# Patient Record
Sex: Male | Born: 1965 | Race: White | Hispanic: No | Marital: Single | Smoking: Former smoker
Health system: Southern US, Community
[De-identification: ages and names within clinical notes are randomized; demographics above are authoritative.]

## PROBLEM LIST (undated history)

## (undated) DIAGNOSIS — I10 Essential (primary) hypertension: Secondary | ICD-10-CM

## (undated) DIAGNOSIS — E669 Obesity, unspecified: Secondary | ICD-10-CM

## (undated) HISTORY — DX: Essential (primary) hypertension: I10

## (undated) HISTORY — DX: Obesity, unspecified: E66.9

---

## 1998-09-30 ENCOUNTER — Emergency Department (HOSPITAL_COMMUNITY): Admission: EM | Admit: 1998-09-30 | Discharge: 1998-09-30 | Payer: Self-pay

## 1998-12-17 ENCOUNTER — Emergency Department (HOSPITAL_COMMUNITY): Admission: EM | Admit: 1998-12-17 | Discharge: 1998-12-17 | Payer: Self-pay | Admitting: Emergency Medicine

## 2006-10-13 ENCOUNTER — Ambulatory Visit: Payer: Self-pay | Admitting: Family Medicine

## 2006-10-13 ENCOUNTER — Encounter: Admission: RE | Admit: 2006-10-13 | Discharge: 2006-10-13 | Payer: Self-pay | Admitting: Family Medicine

## 2007-10-17 ENCOUNTER — Ambulatory Visit: Payer: Self-pay | Admitting: Family Medicine

## 2007-11-16 ENCOUNTER — Ambulatory Visit: Payer: Self-pay | Admitting: Family Medicine

## 2007-12-28 ENCOUNTER — Ambulatory Visit: Payer: Self-pay | Admitting: Family Medicine

## 2008-04-16 ENCOUNTER — Ambulatory Visit: Payer: Self-pay | Admitting: Family Medicine

## 2009-01-08 ENCOUNTER — Ambulatory Visit: Payer: Self-pay | Admitting: Family Medicine

## 2009-01-15 ENCOUNTER — Ambulatory Visit: Payer: Self-pay | Admitting: Family Medicine

## 2013-01-29 ENCOUNTER — Encounter: Payer: Self-pay | Admitting: Medical

## 2013-01-29 ENCOUNTER — Ambulatory Visit (INDEPENDENT_AMBULATORY_CARE_PROVIDER_SITE_OTHER): Payer: Self-pay | Admitting: Medical

## 2013-01-29 VITALS — BP 160/100 | HR 80 | Temp 98.1°F | Resp 16 | Wt 278.0 lb

## 2013-01-29 DIAGNOSIS — E669 Obesity, unspecified: Secondary | ICD-10-CM

## 2013-01-29 DIAGNOSIS — I10 Essential (primary) hypertension: Secondary | ICD-10-CM

## 2013-01-29 MED ORDER — LISINOPRIL 20 MG PO TABS: 20.0000 mg | ORAL_TABLET | Freq: Every day | ORAL | Status: DC

## 2013-01-29 NOTE — Patient Instructions (Signed)
Don't run out of blood pressure medication.  Begin back on Lisinopril 20mg  daily.    Work on losing weight, eating healthier, and exercising regularly, such as walking daily.   I recommend exercising most days of the week using a type of exercise that they would enjoy and stick to such as walking, running, swimming, hiking, biking, aerobics, etc.  I recommend a healthy diet.    Do's:   whole grains such as whole grain pasta, rice, whole grains breads and whole grain cereals.  Use small quantities such as 1/2 cup per serving or 2 slices of bread per serving.    Eat 3-5 fruits daily  Eat beans at least once daily  Eat almonds in small quantities at least 3 days per week    If they eat meat, I recommend small portions of lean meats such as chicken, fish, and [place].  Eat as much NON corn and NON potato vegetables as they like, particularly raw or steamed  Drink several large glasses of water daily  Cautions:  Limit red meat  Limit corn and potatoes  Limit sweets, cake, pie, candy  Limit beer and alcohol  Avoid fried food, fast food, large portions  Avoid sugary drinks such as regular soda and sweet tea

## 2013-01-29 NOTE — Progress Notes (Signed)
Subjective:    Patient here for follow-up of elevated blood pressure.  Last visit here over a year ago.  Ran out of BP medication a few months ago.  He is not exercising and is adherent to a low-salt diet.  However, he uses no other diet discretion.  Blood pressure is not well controlled at home. Cardiac symptoms: none. Patient denies: chest pain, chest pressure/discomfort, dyspnea, fatigue, palpitations and syncope. Cardiovascular risk factors: family history of premature cardiovascular disease, hypertension, male gender, obesity (BMI >= 30 kg/m2) and sedentary lifestyle. Use of agents associated with hypertension: none. History of target organ damage: none.  Lives at home with mother.  Currently unemployed.  The following portions of the patient's history were reviewed and updated as appropriate: allergies, current medications, past family history, past medical history, past social history, past surgical history and problem list.  Past Medical History  Diagnosis Date  . Hypertension   . Obesity     Review of Systems As in subjective above    Objective:   Filed Vitals:   01/29/13 1536  BP: 160/100  Pulse: 80  Temp: 98.1 F (36.7 C)  Resp: 16    General Appearance:    Alert, no distress, obese white male  Neck:   Supple, no lymphadenopathy;  thyroid:  no    enlargement/tenderness/nodules; no carotid   bruit or JVD  Lungs:     Clear to auscultation bilaterally without wheezes, rales or       rhonchi; respirations unlabored   Heart:    Regular rate and rhythm, S1 and S2 normal, no murmur  Abdomen:     Soft, non-tender, non distended, normoactive bowel sounds,    no masses, no hepatosplenomegaly, no bruits  Extremities:   No clubbing, cyanosis or edema  Pulses:   2+ and symmetric all extremities  Skin:   Warm, dry, normal turgor  Neurologic:   CN2-12 intact, normal strength, normal sensation, DTRs 2+, no cerebella signs, normal gait          Psych:   Normal mood, affect, hygiene  and grooming.      Assessment:   Encounter Diagnoses  Name Primary?  . Essential hypertension, benign Yes  . Obesity, unspecified     Plan:   HTN, obesity - advised better compliance, at a minimum - yearly follow up, restart Lisinopril, address lifestyle changes to lose weight, eat healthier, exercise regularly.  Discussed risks and complications of uncontrolled hypertension.  Recheck 50mo on BP and fasting labs.

## 2014-08-26 ENCOUNTER — Ambulatory Visit (INDEPENDENT_AMBULATORY_CARE_PROVIDER_SITE_OTHER): Payer: Self-pay | Admitting: Family Medicine

## 2014-08-26 ENCOUNTER — Encounter: Payer: Self-pay | Admitting: Family Medicine

## 2014-08-26 VITALS — BP 170/100 | HR 86 | Wt 289.0 lb

## 2014-08-26 DIAGNOSIS — I152 Hypertension secondary to endocrine disorders: Secondary | ICD-10-CM | POA: Insufficient documentation

## 2014-08-26 DIAGNOSIS — I10 Essential (primary) hypertension: Secondary | ICD-10-CM

## 2014-08-26 DIAGNOSIS — I517 Cardiomegaly: Secondary | ICD-10-CM

## 2014-08-26 DIAGNOSIS — E669 Obesity, unspecified: Secondary | ICD-10-CM

## 2014-08-26 DIAGNOSIS — R011 Cardiac murmur, unspecified: Secondary | ICD-10-CM

## 2014-08-26 DIAGNOSIS — E1159 Type 2 diabetes mellitus with other circulatory complications: Secondary | ICD-10-CM | POA: Insufficient documentation

## 2014-08-26 LAB — CBC WITH DIFFERENTIAL/PLATELET
Basophils Absolute: 0 10*3/uL (ref 0.0–0.1)
Basophils Relative: 0 % (ref 0–1)
Eosinophils Absolute: 0.2 10*3/uL (ref 0.0–0.7)
Eosinophils Relative: 3 % (ref 0–5)
HCT: 41.2 % (ref 39.0–52.0)
HEMOGLOBIN: 14.4 g/dL (ref 13.0–17.0)
LYMPHS ABS: 2 10*3/uL (ref 0.7–4.0)
Lymphocytes Relative: 26 % (ref 12–46)
MCH: 29.9 pg (ref 26.0–34.0)
MCHC: 35 g/dL (ref 30.0–36.0)
MCV: 85.5 fL (ref 78.0–100.0)
MONOS PCT: 9 % (ref 3–12)
Monocytes Absolute: 0.7 10*3/uL (ref 0.1–1.0)
NEUTROS ABS: 4.8 10*3/uL (ref 1.7–7.7)
NEUTROS PCT: 62 % (ref 43–77)
PLATELETS: 305 10*3/uL (ref 150–400)
RBC: 4.82 MIL/uL (ref 4.22–5.81)
RDW: 14.7 % (ref 11.5–15.5)
WBC: 7.8 10*3/uL (ref 4.0–10.5)

## 2014-08-26 LAB — COMPREHENSIVE METABOLIC PANEL
ALBUMIN: 4.2 g/dL (ref 3.5–5.2)
ALK PHOS: 69 U/L (ref 39–117)
ALT: 14 U/L (ref 0–53)
AST: 16 U/L (ref 0–37)
BUN: 17 mg/dL (ref 6–23)
CO2: 26 meq/L (ref 19–32)
Calcium: 9.1 mg/dL (ref 8.4–10.5)
Chloride: 101 mEq/L (ref 96–112)
Creat: 0.99 mg/dL (ref 0.50–1.35)
GLUCOSE: 97 mg/dL (ref 70–99)
POTASSIUM: 3.4 meq/L — AB (ref 3.5–5.3)
SODIUM: 139 meq/L (ref 135–145)
TOTAL PROTEIN: 7.2 g/dL (ref 6.0–8.3)
Total Bilirubin: 0.5 mg/dL (ref 0.2–1.2)

## 2014-08-26 LAB — LIPID PANEL
Cholesterol: 232 mg/dL — ABNORMAL HIGH (ref 0–200)
HDL: 39 mg/dL — ABNORMAL LOW (ref >39)
LDL CALC: 122 mg/dL — AB (ref 0–99)
TRIGLYCERIDES: 354 mg/dL — AB (ref <150)
Total CHOL/HDL Ratio: 5.9 Ratio
VLDL: 71 mg/dL — AB (ref 0–40)

## 2014-08-26 MED ORDER — LISINOPRIL-HYDROCHLOROTHIAZIDE 20-12.5 MG PO TABS: 1.0000 | ORAL_TABLET | Freq: Every day | ORAL | Status: DC

## 2014-08-26 NOTE — Patient Instructions (Signed)
You must take your blood pressure pill every day.

## 2014-08-26 NOTE — Progress Notes (Signed)
   Subjective:    Patient ID: Stanley Wilkerson, male    DOB: 16-Jul-1966, 48 y.o.   MRN: [id number]  HPI He is here for consult concerning his blood pressure. He has not been taking this on a regular basis. His last visit here was over a year ago. He has no chest pain, shortness of breath, PND.   Review of Systems     Objective:   Physical Exam alert and in no distress. Tympanic membranes and canals are normal. Throat is clear. Tonsils are normal. Neck is supple without adenopathy or thyromegaly. Cardiac exam shows a regular sinus rhythm with a 2/6  Murmur no gallops. Lungs are clear to auscultation. EKG shows LVH with strain pattern.       Assessment & Plan:  Essential hypertension - Plan: CBC with Differential, Comprehensive metabolic panel, Lipid panel, lisinopril-hydrochlorothiazide (ZESTORETIC) 20-12.5 MG per tablet  Obesity (BMI 30-39.9) - Plan: CBC with Differential, Comprehensive metabolic panel, Lipid panel  Undiagnosed cardiac murmurs - Plan: EKG 12-Lead, 2D Echocardiogram without contrast  LVH (left ventricular hypertrophy)  I reinforced the need for him to lose weight and to stay on his blood pressure medication regularly. Explained that blood pressure does not cause a lot of symptoms that people think. I will followup after he has his echo to evaluate the murmur.

## 2014-08-27 ENCOUNTER — Telehealth: Payer: Self-pay | Admitting: Family Medicine

## 2014-08-27 NOTE — Telephone Encounter (Signed)
Pt called and wanted to know what he was supposed to do.  And how to afford the test.  I explained there was paper work at [facility] [employer] for finances.

## 2014-08-29 ENCOUNTER — Ambulatory Visit (HOSPITAL_COMMUNITY)
Admission: RE | Admit: 2014-08-29 | Discharge: 2014-08-29 | Disposition: A | Discharge status: Home / Self Care | Payer: Self-pay | Source: Ambulatory Visit | Attending: Family Medicine | Admitting: Family Medicine

## 2014-08-29 DIAGNOSIS — Z8249 Family history of ischemic heart disease and other diseases of the circulatory system: Secondary | ICD-10-CM | POA: Insufficient documentation

## 2014-08-29 DIAGNOSIS — R011 Cardiac murmur, unspecified: Secondary | ICD-10-CM | POA: Insufficient documentation

## 2014-08-29 DIAGNOSIS — I10 Essential (primary) hypertension: Secondary | ICD-10-CM | POA: Insufficient documentation

## 2014-08-29 DIAGNOSIS — I517 Cardiomegaly: Secondary | ICD-10-CM

## 2014-08-29 NOTE — Progress Notes (Signed)
2D Echocardiogram Complete.  08/29/2014   Stanley Wilkerson [name], [place]

## 2014-09-01 ENCOUNTER — Telehealth: Payer: Self-pay | Admitting: Family Medicine

## 2014-09-01 NOTE — Telephone Encounter (Signed)
Pt called & I gave him labs results and echo result note from [facility].  He had received lab letter and thought all his labs were in the normal range.  Advised pt that no his cholesterol and Lipids are not in normal range.  Pt also wanted to let you know that his father died at 51 and and grandfather died at 41 both from heart attacks. He has appt in 1 month for reck

## 2014-09-25 ENCOUNTER — Encounter: Payer: Self-pay | Admitting: Family Medicine

## 2014-09-25 ENCOUNTER — Ambulatory Visit (INDEPENDENT_AMBULATORY_CARE_PROVIDER_SITE_OTHER): Payer: Self-pay | Admitting: Family Medicine

## 2014-09-25 VITALS — BP 140/92 | HR 88 | Wt 288.0 lb

## 2014-09-25 DIAGNOSIS — I10 Essential (primary) hypertension: Secondary | ICD-10-CM

## 2014-09-25 DIAGNOSIS — E669 Obesity, unspecified: Secondary | ICD-10-CM

## 2014-09-25 MED ORDER — AMLODIPINE BESYLATE 10 MG PO TABS: 10.0000 mg | ORAL_TABLET | Freq: Every day | ORAL | Status: DC

## 2014-09-25 NOTE — Progress Notes (Signed)
   Subjective:    Patient ID: Stanley Wilkerson, male    DOB: 1966-04-27, 48 y.o.   MRN: [id number]  HPI he is here for recheck. He is having no difficulty with his present medication regimen. He has lost 1 pound.  Review of Systems     Objective:   Physical Exam Alert and in no distress. Blood pressure is recorded       Assessment & Plan:  Essential hypertension - Plan: amLODipine (NORVASC) 10 MG tablet  Obesity (BMI 30-39.9)  I will add Norvasc to his regimen and recheck in 1 month.

## 2014-09-29 ENCOUNTER — Telehealth: Payer: Self-pay | Admitting: Internal Medicine

## 2014-09-29 MED ORDER — TERAZOSIN HCL 2 MG PO CAPS: 2.0000 mg | ORAL_CAPSULE | Freq: Every day | ORAL | Status: DC

## 2014-09-29 NOTE — Telephone Encounter (Signed)
Called pt to inform him Dr.Lalnde has called in new med pt verbalized understanding

## 2014-09-29 NOTE — Telephone Encounter (Signed)
Let him know that I called a different medication in. 

## 2014-09-29 NOTE — Telephone Encounter (Signed)
Pt states that amlodipine is $73.00 for a 90 day supply and pt can not afford that. Is there something else that is cheaper. Send to [facility] [address]

## 2014-10-27 ENCOUNTER — Encounter: Payer: Self-pay | Admitting: Family Medicine

## 2014-10-27 ENCOUNTER — Ambulatory Visit (INDEPENDENT_AMBULATORY_CARE_PROVIDER_SITE_OTHER): Payer: Self-pay | Admitting: Family Medicine

## 2014-10-27 VITALS — BP 160/90 | HR 88 | Wt 287.0 lb

## 2014-10-27 DIAGNOSIS — E669 Obesity, unspecified: Secondary | ICD-10-CM

## 2014-10-27 DIAGNOSIS — I10 Essential (primary) hypertension: Secondary | ICD-10-CM

## 2014-10-27 NOTE — Progress Notes (Signed)
   Subjective:    Patient ID: Stanley Wilkerson, male    DOB: January 15, 1966, 48 y.o.   MRN: [id number]  HPI He is here for recheck. He started on his new medication but then had some side effects that he blames on the medication. He then started again approximately one week ago and is having no difficulty with either medication   Review of Systems     Objective:   Physical Exam Alert and in no distress otherwise not examined. Blood pressure still elevated.       Assessment & Plan:  Essential hypertension  Obesity (BMI 30-39.9)  recommend that he stay on both medications regularly for the next month and then recheck since it is still elevated but he has inadequately treated his blood pressure.

## 2014-12-01 ENCOUNTER — Ambulatory Visit (INDEPENDENT_AMBULATORY_CARE_PROVIDER_SITE_OTHER): Payer: Self-pay | Admitting: Family Medicine

## 2014-12-01 ENCOUNTER — Encounter: Payer: Self-pay | Admitting: Family Medicine

## 2014-12-01 VITALS — BP 142/90 | HR 84 | Ht 67.5 in | Wt 284.0 lb

## 2014-12-01 DIAGNOSIS — E1169 Type 2 diabetes mellitus with other specified complication: Secondary | ICD-10-CM | POA: Insufficient documentation

## 2014-12-01 DIAGNOSIS — Z8249 Family history of ischemic heart disease and other diseases of the circulatory system: Secondary | ICD-10-CM

## 2014-12-01 DIAGNOSIS — E669 Obesity, unspecified: Secondary | ICD-10-CM

## 2014-12-01 DIAGNOSIS — I10 Essential (primary) hypertension: Secondary | ICD-10-CM

## 2014-12-01 DIAGNOSIS — E785 Hyperlipidemia, unspecified: Secondary | ICD-10-CM

## 2014-12-01 MED ORDER — PRAVASTATIN SODIUM 40 MG PO TABS: 40.0000 mg | ORAL_TABLET | Freq: Every day | ORAL | Status: DC

## 2014-12-01 NOTE — Patient Instructions (Signed)
No more soft drinks!!!!!

## 2014-12-01 NOTE — Progress Notes (Signed)
   Subjective:    Patient ID: Stanley Wilkerson, male    DOB: 19-Feb-1966, 49 y.o.   MRN: [id number]  HPI He is here for blood pressure recheck. He continues on medications listed in the chart. He has questions about cholesterol and then mentioned the fact that his father died at age 24 of a heart attack. Apparently symptoms of left arm pain started several years before that.   Review of Systems     Objective:   Physical Exam Alert and in no distress. Blood pressure is recorded.       Assessment & Plan:  Essential hypertension  Hyperlipidemia LDL goal <70 - Plan: pravastatin (PRAVACHOL) 40 MG tablet  Family history of heart disease in male family member before age 65  Obesity (BMI 30-39.9)  he will continue on his pleasant blood pressure medications. I will place him on proper call. Discussed possible side effects with him. Also strongly encouraged him to make further dietary changes specifically cutting back on his soda consumption. He admits to drinking 2 or 3 sodas every day. Check here in 3 months.

## 2014-12-03 ENCOUNTER — Telehealth: Payer: Self-pay | Admitting: Family Medicine

## 2014-12-03 MED ORDER — LOVASTATIN 20 MG PO TABS: 20.0000 mg | ORAL_TABLET | Freq: Every day | ORAL | Status: DC

## 2014-12-03 NOTE — Telephone Encounter (Signed)
Pt states Pravastatin too expensive, Can you switch pt to lovastatin, 10 mg or 20mg , which is on the $4 list at  [facility]

## 2015-03-02 ENCOUNTER — Ambulatory Visit: Payer: Self-pay | Admitting: Family Medicine

## 2015-03-03 ENCOUNTER — Ambulatory Visit: Payer: Self-pay | Admitting: Family Medicine

## 2015-09-24 ENCOUNTER — Encounter: Payer: Self-pay | Admitting: Family Medicine

## 2015-09-24 ENCOUNTER — Ambulatory Visit (INDEPENDENT_AMBULATORY_CARE_PROVIDER_SITE_OTHER): Payer: Self-pay | Admitting: Family Medicine

## 2015-09-24 VITALS — BP 140/90 | HR 68 | Wt 287.2 lb

## 2015-09-24 DIAGNOSIS — S161XXA Strain of muscle, fascia and tendon at neck level, initial encounter: Secondary | ICD-10-CM
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
## 2015-09-24 NOTE — Patient Instructions (Signed)
Use heat 20 minutes to neck and do stretches we talked about in the office. You can take 2 Aleve twice daily with food three times daily if needed for pain. Let me know if your feeling better in next 2-3 days.

## 2015-12-22 ENCOUNTER — Telehealth: Payer: Self-pay | Admitting: Family Medicine

## 2015-12-22 DIAGNOSIS — I10 Essential (primary) hypertension: Secondary | ICD-10-CM

## 2015-12-22 MED ORDER — LISINOPRIL-HYDROCHLOROTHIAZIDE 20-12.5 MG PO TABS: 1.0000 | ORAL_TABLET | Freq: Every day | ORAL | Status: DC

## 2015-12-22 NOTE — Telephone Encounter (Signed)
Refilled for 30 days only. Pt was notified that he needs an appt before anymore refills

## 2015-12-22 NOTE — Telephone Encounter (Signed)
Pt needs refill on his BP pills

## 2016-03-21 ENCOUNTER — Telehealth: Payer: Self-pay | Admitting: Family Medicine

## 2016-03-21 DIAGNOSIS — I10 Essential (primary) hypertension: Secondary | ICD-10-CM

## 2016-03-21 MED ORDER — TERAZOSIN HCL 2 MG PO CAPS: 2.0000 mg | ORAL_CAPSULE | Freq: Every day | ORAL | Status: DC

## 2016-03-21 MED ORDER — LISINOPRIL-HYDROCHLOROTHIAZIDE 20-12.5 MG PO TABS: 1.0000 | ORAL_TABLET | Freq: Every day | ORAL | Status: DC

## 2016-03-21 NOTE — Telephone Encounter (Signed)
Dr.Lalonde is this okay 

## 2016-03-21 NOTE — Telephone Encounter (Signed)
Pt requested refill on his BP meds, no job & insurance at this time and can't afford to come in and ask if you can refill

## 2016-08-19 ENCOUNTER — Telehealth: Payer: Self-pay | Admitting: Family Medicine

## 2016-08-19 DIAGNOSIS — I10 Essential (primary) hypertension: Secondary | ICD-10-CM

## 2016-08-19 MED ORDER — LISINOPRIL-HYDROCHLOROTHIAZIDE 20-12.5 MG PO TABS: 1.0000 | ORAL_TABLET | Freq: Every day | ORAL | 0 refills | Status: DC

## 2016-08-19 NOTE — Telephone Encounter (Signed)
Pt asked for refill BP meds, he doesn't have insurance at this time and no money to come in.

## 2016-08-19 NOTE — Telephone Encounter (Signed)
Ok

## 2016-08-19 NOTE — Telephone Encounter (Signed)
Sent med to pharmacy  

## 2016-12-13 ENCOUNTER — Telehealth: Payer: Self-pay | Admitting: Family Medicine

## 2016-12-13 DIAGNOSIS — I10 Essential (primary) hypertension: Secondary | ICD-10-CM

## 2016-12-13 MED ORDER — LISINOPRIL-HYDROCHLOROTHIAZIDE 20-12.5 MG PO TABS: 1.0000 | ORAL_TABLET | Freq: Every day | ORAL | 1 refills | Status: DC

## 2016-12-13 MED ORDER — TERAZOSIN HCL 2 MG PO CAPS: 2.0000 mg | ORAL_CAPSULE | Freq: Every day | ORAL | 1 refills | Status: DC

## 2016-12-13 NOTE — Telephone Encounter (Signed)
Pt still doesn't have insurance and wants to see if he can get another refill on BP meds, not working much with winter months, no money for appointment at this time.

## 2016-12-15 NOTE — Telephone Encounter (Signed)
Meds called in

## 2016-12-20 ENCOUNTER — Ambulatory Visit (INDEPENDENT_AMBULATORY_CARE_PROVIDER_SITE_OTHER): Payer: Self-pay | Admitting: Family Medicine

## 2016-12-20 VITALS — BP 150/90 | HR 70 | Wt 289.8 lb

## 2016-12-20 DIAGNOSIS — M25512 Pain in left shoulder: Secondary | ICD-10-CM

## 2016-12-20 DIAGNOSIS — I10 Essential (primary) hypertension: Secondary | ICD-10-CM

## 2016-12-20 MED ORDER — AMLODIPINE BESYLATE 5 MG PO TABS: 5.0000 mg | ORAL_TABLET | Freq: Every day | ORAL | 3 refills | Status: DC

## 2016-12-20 NOTE — Patient Instructions (Signed)
Take 800 mg of ibuprofen 3 times per day the next 2 weeks

## 2016-12-20 NOTE — Progress Notes (Signed)
   Subjective:    Patient ID: Beatriz Chancellor, male    DOB: [birth year], 51 y.o.   MRN: [id number]  HPI He complains of a one-day history of left shoulder pain. No history of previous injury or overuse. This started when he was taking groceries home. No numbness, tingling or weakness. He also states that he stopped taking the Hytrin due to the fact that it made him feel funny.   Review of Systems     Objective:   Physical Exam Questionable tenderness to the anterior part of the shoulder. No tenderness over before meals joint or bicipital groove. Full motion of the shoulder. Negative drop arm test. Supraspinatus testing was negative. Neer's and Hawkins test negative.       Assessment & Plan:  Essential hypertension - Plan: amLODipine (NORVASC) 5 MG tablet  Acute pain of left shoulder Recommend conservative care for the shoulder with ibuprofen 800 mg 3 times a day. Will also add Norvasc to his regimen. He should return here in one month for a recheck.

## 2016-12-21 ENCOUNTER — Encounter: Payer: Self-pay | Admitting: Family Medicine

## 2016-12-22 ENCOUNTER — Telehealth: Payer: Self-pay | Admitting: Family Medicine

## 2016-12-22 NOTE — Telephone Encounter (Signed)
Pt stopped by and states in the center of arm at shoulder is numb, burns and thumb is numb and tingling.  Please advise pt

## 2016-12-22 NOTE — Telephone Encounter (Signed)
Needs to get more time

## 2016-12-26 NOTE — Telephone Encounter (Signed)
Advised pt of same. 

## 2017-03-26 ENCOUNTER — Emergency Department (HOSPITAL_COMMUNITY)
Admission: EM | Admit: 2017-03-26 | Discharge: 2017-03-26 | Disposition: A | Discharge status: Home / Self Care | Payer: Self-pay | Attending: Emergency Medicine | Admitting: Emergency Medicine

## 2017-03-26 ENCOUNTER — Emergency Department (HOSPITAL_COMMUNITY): Payer: Self-pay

## 2017-03-26 ENCOUNTER — Encounter (HOSPITAL_COMMUNITY): Payer: Self-pay

## 2017-03-26 DIAGNOSIS — R002 Palpitations: Secondary | ICD-10-CM | POA: Insufficient documentation

## 2017-03-26 DIAGNOSIS — Z87891 Personal history of nicotine dependence: Secondary | ICD-10-CM | POA: Insufficient documentation

## 2017-03-26 DIAGNOSIS — I10 Essential (primary) hypertension: Secondary | ICD-10-CM | POA: Insufficient documentation

## 2017-03-26 DIAGNOSIS — M25562 Pain in left knee: Secondary | ICD-10-CM | POA: Insufficient documentation

## 2017-03-26 DIAGNOSIS — G8929 Other chronic pain: Secondary | ICD-10-CM | POA: Insufficient documentation

## 2017-03-26 LAB — I-STAT CHEM 8, ED
BUN: 18 mg/dL (ref 6–20)
CHLORIDE: 97 mmol/L — AB (ref 101–111)
CREATININE: 0.9 mg/dL (ref 0.61–1.24)
Calcium, Ion: 1.14 mmol/L — ABNORMAL LOW (ref 1.15–1.40)
GLUCOSE: 113 mg/dL — AB (ref 65–99)
HCT: 40 % (ref 39.0–52.0)
Hemoglobin: 13.6 g/dL (ref 13.0–17.0)
POTASSIUM: 3.3 mmol/L — AB (ref 3.5–5.1)
Sodium: 139 mmol/L (ref 135–145)
TCO2: 34 mmol/L (ref 0–100)

## 2017-03-26 LAB — I-STAT TROPONIN, ED: Troponin i, poc: 0.01 ng/mL (ref 0.00–0.08)

## 2017-03-26 MED ORDER — TRAMADOL HCL 50 MG PO TABS: 50.0000 mg | ORAL_TABLET | Freq: Two times a day (BID) | ORAL | 0 refills | Status: DC | PRN

## 2017-03-26 MED ORDER — POTASSIUM CHLORIDE CRYS ER 20 MEQ PO TBCR
40.0000 meq | EXTENDED_RELEASE_TABLET | Freq: Once | ORAL | Status: AC
  Administered 2017-03-26: 40 meq via ORAL
  Filled 2017-03-26: qty 2

## 2017-03-26 NOTE — ED Triage Notes (Signed)
States left knee pain for about a month able to bear weight no swelling noted and states for about a week states feels weak after a fast heart rate event.

## 2017-03-26 NOTE — Discharge Instructions (Signed)
Please follow up with heart doctor for further evaluation of your recurrent heart palpitation.  You may need a Holter heart monitor.  Eat green leafy vegetable and banana to help with your potassium level.  Wear your knee sleeve, exercise regularly and aim for weight loss as it will help your left knee pain from arthritis.  Take tramadol when your pain is severe.

## 2017-03-26 NOTE — ED Notes (Signed)
Pt reports todays complaints of randomized pain in left knee and shoulder that has been occurring for the last several months. Pt also reports palpitations that occur randomly. At present pt is not exhibiting any irregularities in heart rhythm.

## 2017-03-26 NOTE — ED Provider Notes (Signed)
[facility] DEPT Provider Note   CSN: [id number] Arrival date & time: 03/26/17  1930     History   Chief Complaint Chief Complaint  Patient presents with  . Knee Pain  . Tachycardia    HPI Stanley Wilkerson is a 51 y.o. male.  HPI   51 year old obese male with history of hypertension presenting complaining of knee pain and heart palpitation. Patient states for at least 3-4 months he has had recurrent left knee pain. Pain is to the lateral aspects of his knee, usually worsen with initiation of movement and standing and improves with rest. He uses a knee sleeves and Advil on occasion with some improvement. Symptoms still recurrent. Denies any specific injury to the knee. Denies any hip or ankle pain. No active pain at this time. No numbness or weakness.  Furthermore, patient also complaining of having intermittent heart palpitation. States that heart palpitation has been a recurrent symptoms ongoing for the past 2 weeks. Sometimes it may last for more than an hour and when it does he feels weak and tired and doesn't want to do anything. He denies any associated chest pain or shortness of breath. No report of lightheadedness of dizziness, just "feels weird" he admits to drinking soda but denies any other caffeine use, does not drink coffee and no recent medication changes. He is a nonsmoker or alcohol abuser. Does not have a PCP. He does admits to having history of high blood pressure and did not take his blood pressure medication today.  Past Medical History:  Diagnosis Date  . Hypertension   . Obesity     Patient Active Problem List   Diagnosis Date Noted  . Family history of heart disease in male family member before age 48 12/01/2014  . Hyperlipidemia LDL goal <70 12/01/2014  . LVH (left ventricular hypertrophy) 08/26/2014  . Obesity (BMI 30-39.9) 08/26/2014  . Essential hypertension 08/26/2014    History reviewed. No pertinent surgical history.     Home Medications     Prior to Admission medications   Medication Sig Start Date End Date Taking? Authorizing Provider  amLODipine (NORVASC) 5 MG tablet Take 1 tablet (5 mg total) by mouth daily. 12/20/16   Denita Lung, MD  lisinopril-hydrochlorothiazide (ZESTORETIC) 20-12.5 MG tablet Take 1 tablet by mouth daily. 12/13/16   Denita Lung, MD  lovastatin (MEVACOR) 20 MG tablet Take 1 tablet (20 mg total) by mouth at bedtime. Patient not taking: Reported on 09/24/2015 12/03/14   Denita Lung, MD    Family History Family History  Problem Relation Age of Onset  . Heart disease Father     Social History Social History  Substance Use Topics  . Smoking status: Former Research scientist (life sciences)  . Smokeless tobacco: Never Used  . Alcohol use No     Allergies   Penicillins   Review of Systems Review of Systems  All other systems reviewed and are negative.    Physical Exam Updated Vital Signs BP (!) 180/99 (BP Location: Left Arm)   Pulse 72   Temp 98.1 F (36.7 C) (Oral)   Resp 18   SpO2 98%   Physical Exam  Constitutional: He appears well-developed and well-nourished. No distress.  HENT:  Head: Atraumatic.  Eyes: Conjunctivae are normal.  Neck: Neck supple.  Cardiovascular: Normal rate and regular rhythm.   Pulmonary/Chest: Effort normal and breath sounds normal.  Abdominal: Soft. Bowel sounds are normal. He exhibits no distension. There is no tenderness.  Musculoskeletal: He exhibits tenderness (  Left knee: Mild tenderness to the lateral joint line on the. No joint laxity, no deformity, normal knee flexion extension, no overlying skin changes, no crepitus.).  Left hip and left ankle nontender. Dorsalis pedis pulse palpable.  Neurological: He is alert.  Skin: No rash noted.  Psychiatric: He has a normal mood and affect.  Nursing note and vitals reviewed.    ED Treatments / Results  Labs (all labs ordered are listed, but only abnormal results are displayed) Labs Reviewed  I-STAT CHEM 8, ED -  Abnormal; Notable for the following:       Result Value   Potassium 3.3 (*)    Chloride 97 (*)    Glucose, Bld 113 (*)    Calcium, Ion 1.14 (*)    All other components within normal limits  I-STAT TROPOININ, ED    EKG  EKG Interpretation  Date/Time:  Sunday March 26 2017 19:37:10 EDT Ventricular Rate:  68 PR Interval:    QRS Duration: 97 QT Interval:  439 QTC Calculation: 467 R Axis:   66 Text Interpretation:  Sinus rhythm LAE, consider biatrial enlargement Repol abnrm suggests ischemia, lateral leads No significant change since last tracing 08/22/14 Reconfirmed by Hazle Coca [phone]) on 03/26/2017 9:26:31 PM       Radiology Dg Chest 2 View  Result Date: 03/26/2017 CLINICAL DATA:  Heart palpitations. EXAM: CHEST  2 VIEW COMPARISON:  10/13/2006 FINDINGS: Cardiomediastinal silhouette is normal. Mediastinal contours appear intact. There is no evidence of focal airspace consolidation, pleural effusion or pneumothorax. Osseous structures are without acute abnormality. Soft tissues are grossly normal. IMPRESSION: No active cardiopulmonary disease. Electronically Signed   By: Fidela Salisbury M.D.   On: 03/26/2017 20:56   Dg Knee 2 Views Left  Result Date: 03/26/2017 CLINICAL DATA:  Generalized left knee pain. EXAM: LEFT KNEE - 1-2 VIEW COMPARISON:  None. FINDINGS: No evidence of fracture, dislocation, or joint effusion. Faint calcifications within the medial and lateral joint compartments are seen. No significant joint space narrowing. Soft tissues are unremarkable. IMPRESSION: No acute fracture or dislocation identified about the left knee. Faint calcifications within the medial and lateral joint compartments, which may represent meniscal calcifications or CPPD arthropathy. Electronically Signed   By: Fidela Salisbury M.D.   On: 03/26/2017 20:59    Procedures Procedures (including critical care time)  Medications Ordered in ED Medications  potassium chloride SA (K-DUR,KLOR-CON)  CR tablet 40 mEq (40 mEq Oral Given 03/26/17 2123)     Initial Impression / Assessment and Plan / ED Course  I have reviewed the triage vital signs and the nursing notes.  Pertinent labs & imaging results that were available during my care of the patient were reviewed by me and considered in my medical decision making (see chart for details).     BP (!) 148/87 (BP Location: Left Arm)   Pulse 66   Temp 98.1 F (36.7 C) (Oral)   Resp 14   Ht 5\' 7"  (1.702 m)   Wt 131.1 kg   SpO2 97%   BMI 45.26 kg/m    Final Clinical Impressions(s) / ED Diagnoses   Final diagnoses:  Chronic pain of left knee  Heart palpitations    New Prescriptions New Prescriptions   TRAMADOL (ULTRAM) 50 MG TABLET    Take 1 tablet (50 mg total) by mouth every 12 (twelve) hours as needed for moderate pain.   8:37 PM Patient here with multiple complaints, one is his heart palpitation which has been a recurrent theme  for the past 2 weeks, the other is his recurrent left lateral knee pain. No significant tenderness on evaluation of his left knee. He is not tachycardic and no appreciable heart palpitation noted on exam. Plan to obtain an i-STAT Chem-8 to check his electrolytes, will obtain an EKG, x-ray of his chest and his left knee.  9:42 PM Labs showing mild hypokalemia, K+ 3.3.  Supplementation given.  Recommend eating banana daily x 1 week.  Xray of L knee without acute changes, faint calcification within the medial and lateral joint compartment which may represent meniscal calcification or CPPD arthropathy.  I believe this does support his pain, likely arthritis.  Encourage knee sleeve, Tylenol and outpt f/u with ortho as needed.  Report heart palpitation, no concern arrhythmia on EKG, CXR unremarkable, and normal trop.  Recommend avoid soda use, encourage outpt f/u with cardiology for care.  Return precaution given.    Domenic Moras, PA-C 03/26/17 [place], MD 03/27/17 Dyann Kief

## 2017-03-26 NOTE — ED Notes (Signed)
Patient transported to X-ray 

## 2017-05-19 ENCOUNTER — Other Ambulatory Visit: Payer: Self-pay

## 2017-05-19 ENCOUNTER — Telehealth: Payer: Self-pay | Admitting: Family Medicine

## 2017-05-19 DIAGNOSIS — I10 Essential (primary) hypertension: Secondary | ICD-10-CM

## 2017-05-19 MED ORDER — LISINOPRIL-HYDROCHLOROTHIAZIDE 20-12.5 MG PO TABS
1.0000 | ORAL_TABLET | Freq: Every day | ORAL | 1 refills | Status: DC
Start: 2017-05-19 — End: 2018-03-09

## 2017-05-19 NOTE — Telephone Encounter (Signed)
Lisinopril sent in  

## 2017-05-19 NOTE — Telephone Encounter (Signed)
Pt called and states that he needs a refill on his lisinopril pt use [place], [place] - [address] pt can be reached at [phone]

## 2018-03-09 ENCOUNTER — Other Ambulatory Visit: Payer: Self-pay

## 2018-03-09 ENCOUNTER — Telehealth: Payer: Self-pay | Admitting: Medical

## 2018-03-09 DIAGNOSIS — I10 Essential (primary) hypertension: Secondary | ICD-10-CM

## 2018-03-09 MED ORDER — AMLODIPINE BESYLATE 5 MG PO TABS: 5.0000 mg | ORAL_TABLET | Freq: Every day | ORAL | 0 refills | Status: DC

## 2018-03-09 MED ORDER — LISINOPRIL-HYDROCHLOROTHIAZIDE 20-12.5 MG PO TABS: 1.0000 | ORAL_TABLET | Freq: Every day | ORAL | 0 refills | Status: DC

## 2018-03-09 NOTE — Telephone Encounter (Signed)
pls send 1 round of his BP medications

## 2018-03-09 NOTE — Telephone Encounter (Signed)
Completed.

## 2018-03-09 NOTE — Telephone Encounter (Signed)
Pt needs refills on bp med. [facility].

## 2018-08-09 IMAGING — CR DG KNEE 1-2V*L*
2 series · 2 of 2 positions shown · non-contrast
Comparison: None.

CLINICAL DATA: Generalized left knee pain.

EXAM:
LEFT KNEE - 1-2 VIEW

[t knee ap left]
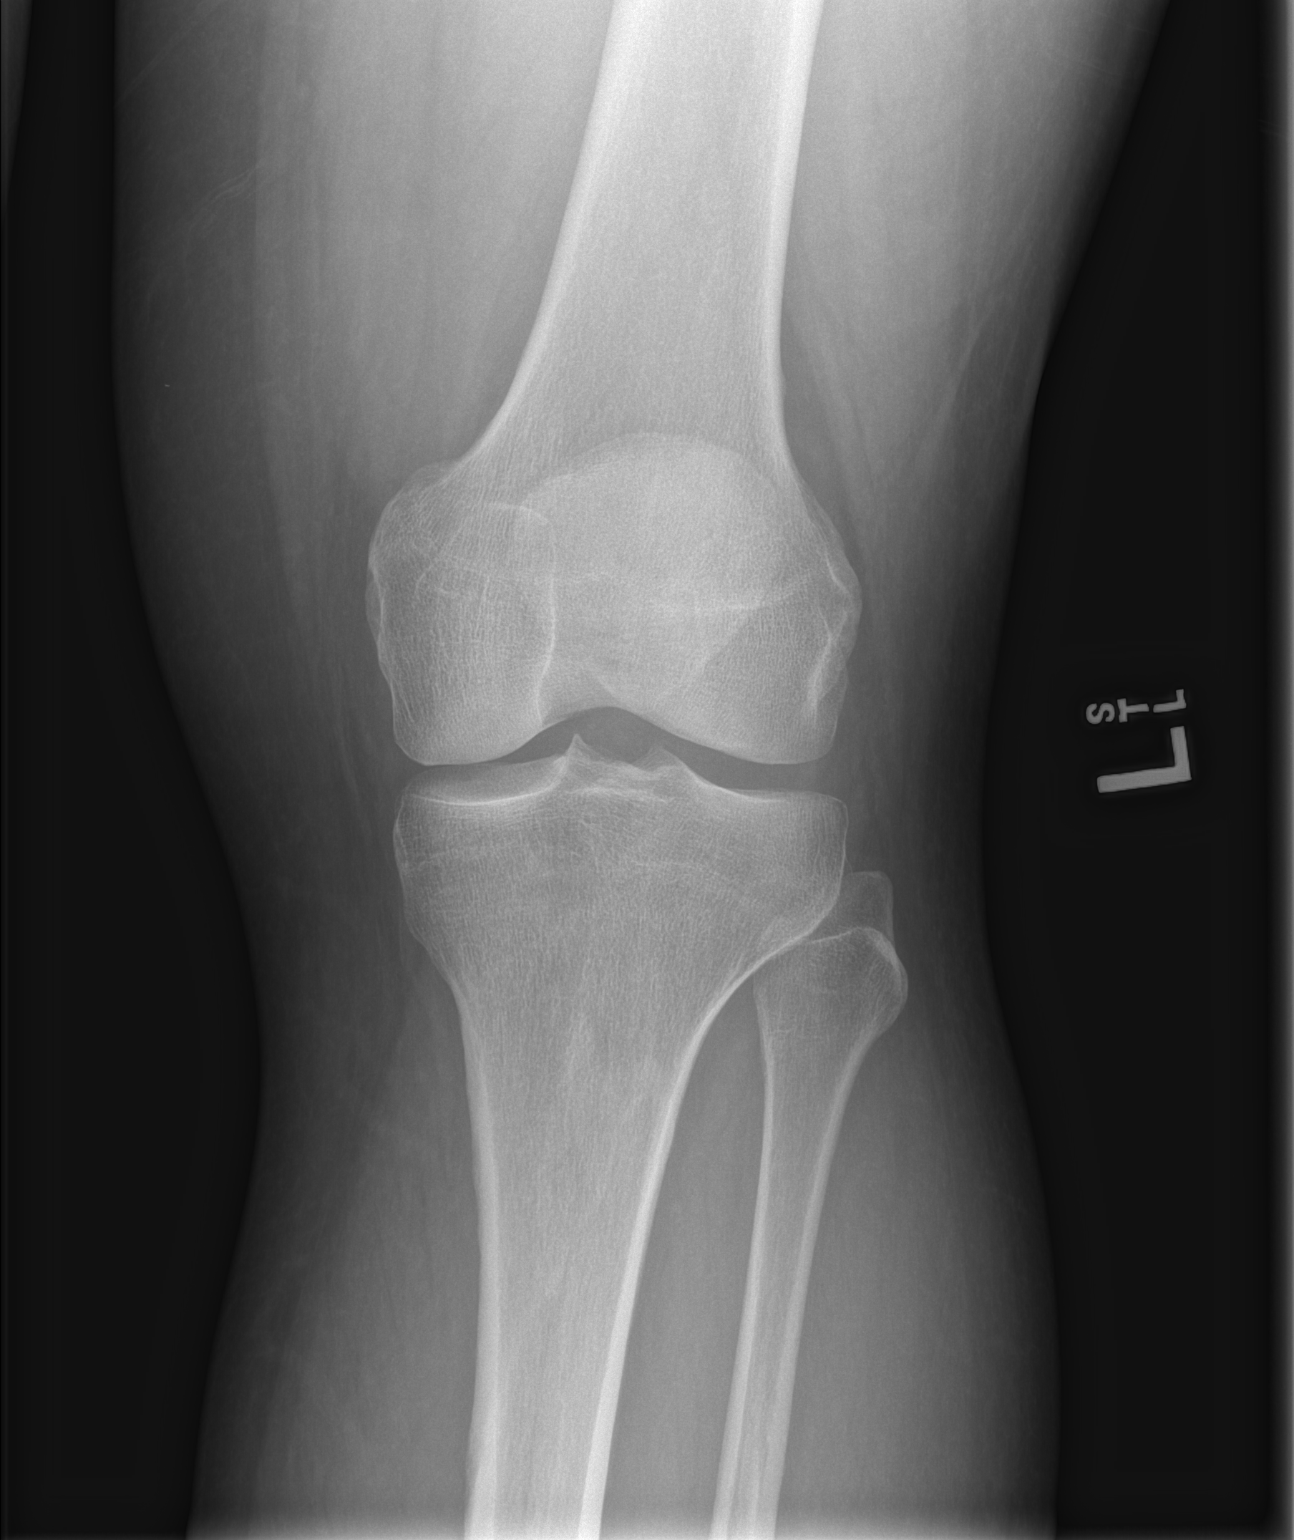

[t knee lat left]
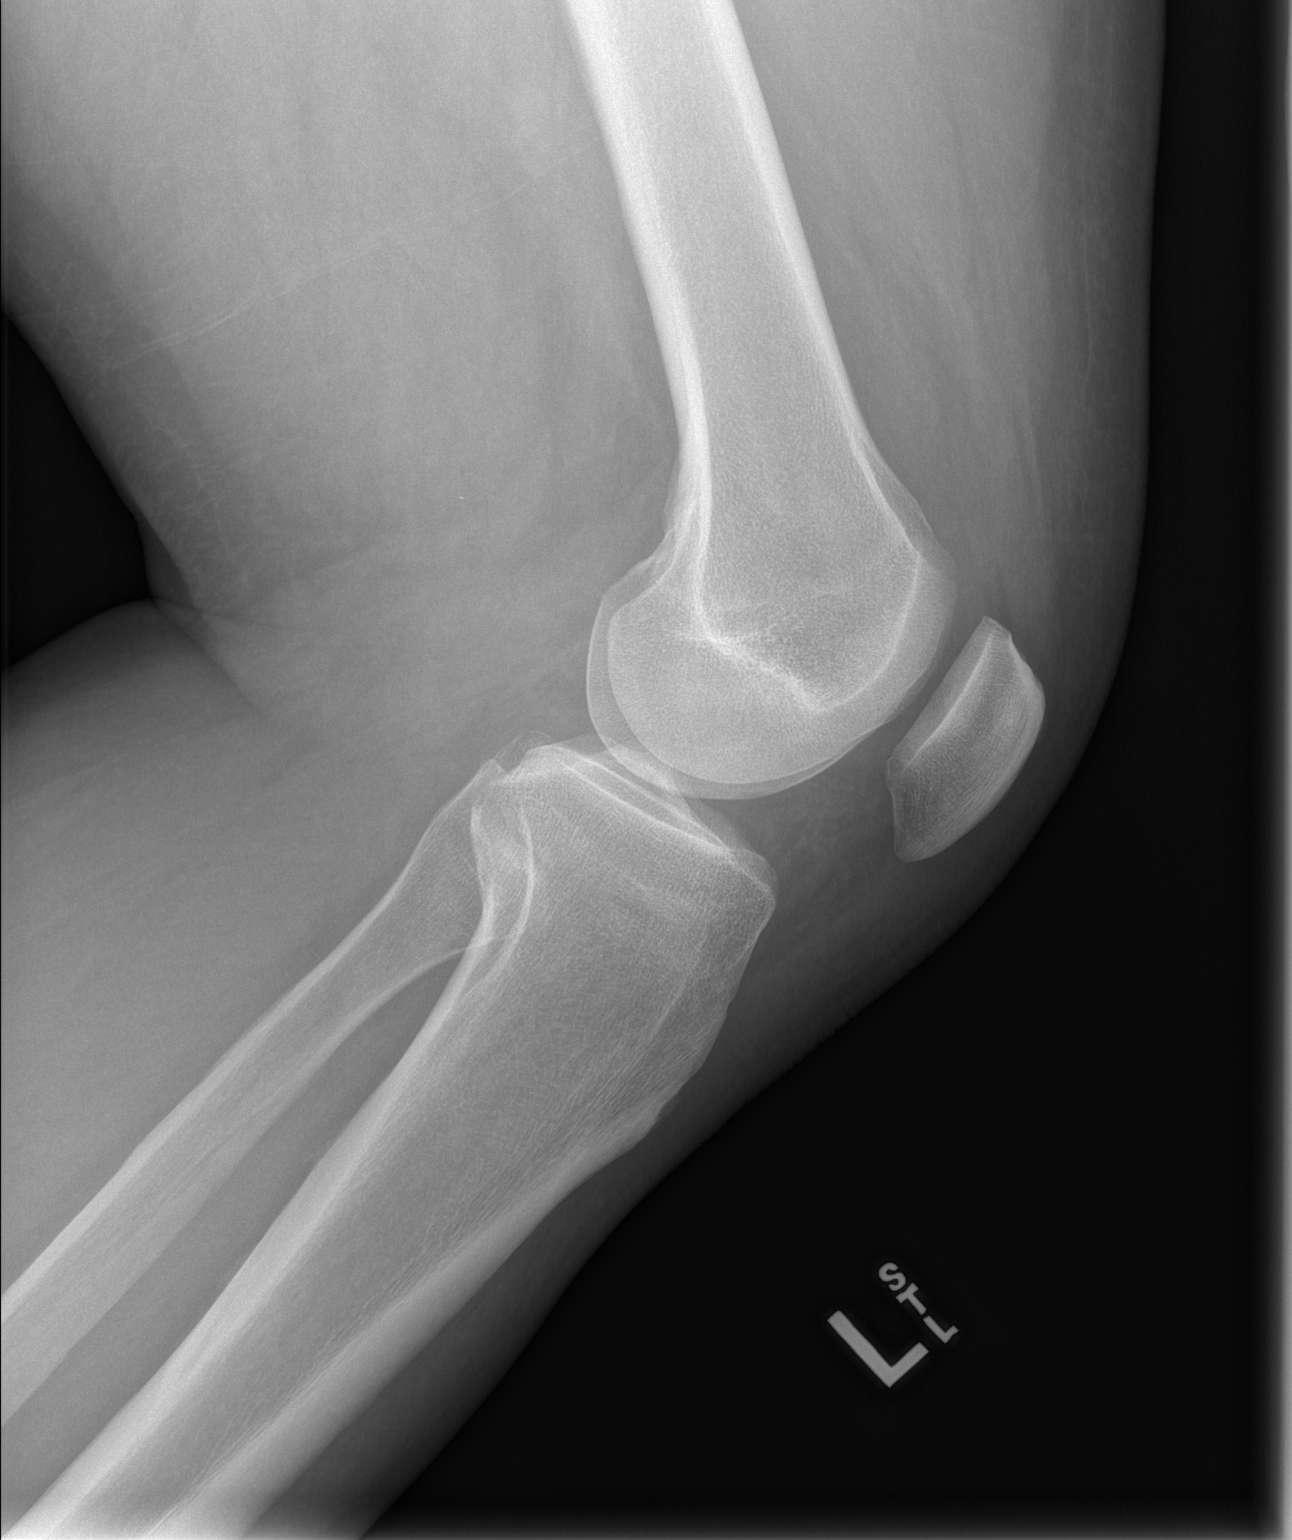

[2 of 2 positions shown; findings below may reference images not displayed]

FINDINGS: No evidence of fracture, dislocation, or joint effusion. Faint
calcifications within the medial and lateral joint compartments are
seen. No significant joint space narrowing. Soft tissues are
unremarkable.
IMPRESSION: No acute fracture or dislocation identified about the left knee.

Faint calcifications within the medial and lateral joint
compartments, which may represent meniscal calcifications or CPPD
arthropathy.

## 2019-02-07 ENCOUNTER — Encounter: Payer: Self-pay | Admitting: Family Medicine

## 2019-02-07 ENCOUNTER — Other Ambulatory Visit: Payer: Self-pay

## 2019-02-07 ENCOUNTER — Ambulatory Visit (INDEPENDENT_AMBULATORY_CARE_PROVIDER_SITE_OTHER): Payer: 59 | Admitting: Family Medicine

## 2019-02-07 VITALS — BP 160/98 | HR 71 | Temp 98.4°F | Ht 67.0 in | Wt 272.6 lb

## 2019-02-07 DIAGNOSIS — R0683 Snoring: Secondary | ICD-10-CM | POA: Diagnosis not present

## 2019-02-07 DIAGNOSIS — Z0000 Encounter for general adult medical examination without abnormal findings: Secondary | ICD-10-CM

## 2019-02-07 DIAGNOSIS — Z23 Encounter for immunization: Secondary | ICD-10-CM

## 2019-02-07 DIAGNOSIS — R5383 Other fatigue: Secondary | ICD-10-CM | POA: Diagnosis not present

## 2019-02-07 DIAGNOSIS — E785 Hyperlipidemia, unspecified: Secondary | ICD-10-CM | POA: Diagnosis not present

## 2019-02-07 DIAGNOSIS — Z1211 Encounter for screening for malignant neoplasm of colon: Secondary | ICD-10-CM

## 2019-02-07 DIAGNOSIS — Z8249 Family history of ischemic heart disease and other diseases of the circulatory system: Secondary | ICD-10-CM | POA: Diagnosis not present

## 2019-02-07 DIAGNOSIS — E669 Obesity, unspecified: Secondary | ICD-10-CM

## 2019-02-07 DIAGNOSIS — I10 Essential (primary) hypertension: Secondary | ICD-10-CM | POA: Diagnosis not present

## 2019-02-07 DIAGNOSIS — I517 Cardiomegaly: Secondary | ICD-10-CM

## 2019-02-07 LAB — POCT URINALYSIS DIP (PROADVANTAGE DEVICE)
BILIRUBIN UA: NEGATIVE
BILIRUBIN UA: NEGATIVE mg/dL
Blood, UA: NEGATIVE
Glucose, UA: NEGATIVE mg/dL
Leukocytes, UA: NEGATIVE
Nitrite, UA: NEGATIVE
SPECIFIC GRAVITY, URINE: 1.025
Urobilinogen, Ur: 3.5
pH, UA: 6 (ref 5.0–8.0)

## 2019-02-07 MED ORDER — ATORVASTATIN CALCIUM 20 MG PO TABS: 20.0000 mg | ORAL_TABLET | Freq: Every day | ORAL | 4 refills | Status: DC

## 2019-02-07 MED ORDER — LISINOPRIL-HYDROCHLOROTHIAZIDE 20-12.5 MG PO TABS: 1.0000 | ORAL_TABLET | Freq: Every day | ORAL | 0 refills | Status: DC

## 2019-02-07 MED ORDER — AMLODIPINE BESYLATE 5 MG PO TABS: 5.0000 mg | ORAL_TABLET | Freq: Every day | ORAL | 0 refills | Status: DC

## 2019-02-07 NOTE — Progress Notes (Signed)
Subjective:    Patient ID: Stanley Wilkerson, male    DOB: Mar 29, 1966, 53 y.o.   MRN: [id number]  HPI He is here for complete examination.  He has not been taking his medications regularly.  He stopped taking his statin drug and has not taken the full complement of him as blood pressure medications.  He does complain of snoring and also gives a history of falling asleep easily even while at work.  He also complains of fatigue.  Does complain of some right hip/groin discomfort but this goes away fairly quickly.  He does have a family history of heart disease.  His exercise level is quite limited.  Does have a previous history of left atrial dilatation and has not had follow-up in 5 years.  He does not smoke or drink.  He is not sexually active.  Family and social history as well as health maintenance and immunizations was also reviewed.   Review of Systems  All other systems reviewed and are negative.      Objective:   Physical Exam BP (!) 160/98 (BP Location: Left Arm, Patient Position: Sitting)   Pulse 71   Temp 98.4 F (36.9 C)   Ht 5\' 7"  (1.702 m)   Wt 272 lb 9.6 oz (123.7 kg)   SpO2 97%   BMI 42.70 kg/m   General Appearance:    Alert, cooperative, no distress, appears stated age  Head:    Normocephalic, without obvious abnormality, atraumatic  Eyes:    PERRL, conjunctiva/corneas clear, EOM's intact, fundi    benign  Ears:    Normal TM's and external ear canals  Nose:   Nares normal, mucosa normal, no drainage or sinus   tenderness  Throat:   Lips, mucosa, and tongue normal; teeth and gums normal  Neck:   Supple, no lymphadenopathy;  thyroid:  no   enlargement/tenderness/nodules; no carotid   bruit or JVD  Back:    Spine nontender, no curvature, ROM normal, no CVA     tenderness  Lungs:     Clear to auscultation bilaterally without wheezes, rales or     ronchi; respirations unlabored      Heart:    Regular rate and rhythm, S1 and S2 normal, 2/6 SEM noted.     Abdomen:      Soft, non-tender, nondistended, normoactive bowel sounds,    no masses, no hepatosplenomegaly  Genitalia:   Deferred  Rectal:   Deferred  Extremities:   No clubbing, cyanosis or edema right hip motion is normal.  Pulses:   2+ and symmetric all extremities  Skin:   Skin color, texture, turgor normal, no rashes or lesions  Lymph nodes:   Cervical, supraclavicular, and axillary nodes normal  Neurologic:   CNII-XII intact, normal strength, sensation and gait; reflexes 2+ and symmetric throughout          Psych:   Normal mood, affect, hygiene and grooming.          Assessment & Plan:  Routine general medical examination at a health care facility - Plan: CBC with Differential/Platelet, Comprehensive metabolic panel, Lipid panel, POCT Urinalysis DIP (Proadvantage Device)  Snoring - Plan: Home sleep test  Essential hypertension - Plan: lisinopril-hydrochlorothiazide (ZESTORETIC) 20-12.5 MG tablet, amLODipine (NORVASC) 5 MG tablet  Family history of heart disease in male family member before age 62  Hyperlipidemia LDL goal <70 - Plan: atorvastatin (LIPITOR) 20 MG tablet  LVH (left ventricular hypertrophy) - Plan: ECHOCARDIOGRAM COMPLETE  Obesity (BMI 30-39.9)  Fatigue, unspecified type - Plan: Testosterone  Screening for colon cancer - Plan: Cologuard  Need for Tdap vaccination - Plan: Tdap vaccine greater than or equal to 7yo IM  Atrial dilatation - Plan: ECHOCARDIOGRAM COMPLETE  I will place him back on his medications.  He will also have echocardiogram, blood studies, sleep study.  He probably does have sleep apnea.  It is essentially time to get him back into the medical system and get him tuned up.

## 2019-02-08 LAB — CBC WITH DIFFERENTIAL/PLATELET
BASOS ABS: 0 10*3/uL (ref 0.0–0.2)
Basos: 0 %
EOS (ABSOLUTE): 0.2 10*3/uL (ref 0.0–0.4)
Eos: 2 %
HEMOGLOBIN: 14.5 g/dL (ref 13.0–17.7)
Hematocrit: 42.1 % (ref 37.5–51.0)
IMMATURE GRANS (ABS): 0 10*3/uL (ref 0.0–0.1)
IMMATURE GRANULOCYTES: 0 %
LYMPHS: 27 %
Lymphocytes Absolute: 2.1 10*3/uL (ref 0.7–3.1)
MCH: 29.4 pg (ref 26.6–33.0)
MCHC: 34.4 g/dL (ref 31.5–35.7)
MCV: 85 fL (ref 79–97)
MONOCYTES: 7 %
Monocytes Absolute: 0.6 10*3/uL (ref 0.1–0.9)
NEUTROS ABS: 4.9 10*3/uL (ref 1.4–7.0)
NEUTROS PCT: 64 %
Platelets: 285 10*3/uL (ref 150–450)
RBC: 4.93 x10E6/uL (ref 4.14–5.80)
RDW: 13.8 % (ref 11.6–15.4)
WBC: 7.8 10*3/uL (ref 3.4–10.8)

## 2019-02-08 LAB — COMPREHENSIVE METABOLIC PANEL
ALBUMIN: 4.2 g/dL (ref 3.8–4.9)
ALT: 18 IU/L (ref 0–44)
AST: 19 IU/L (ref 0–40)
Albumin/Globulin Ratio: 1.5 (ref 1.2–2.2)
Alkaline Phosphatase: 65 IU/L (ref 39–117)
BUN / CREAT RATIO: 13 (ref 9–20)
BUN: 14 mg/dL (ref 6–24)
Bilirubin Total: 0.6 mg/dL (ref 0.0–1.2)
CALCIUM: 8.9 mg/dL (ref 8.7–10.2)
CO2: 23 mmol/L (ref 20–29)
CREATININE: 1.04 mg/dL (ref 0.76–1.27)
Chloride: 98 mmol/L (ref 96–106)
GFR, EST AFRICAN AMERICAN: 95 mL/min/{1.73_m2} (ref >59)
GFR, EST NON AFRICAN AMERICAN: 82 mL/min/{1.73_m2} (ref >59)
Globulin, Total: 2.8 g/dL (ref 1.5–4.5)
Glucose: 101 mg/dL — ABNORMAL HIGH (ref 65–99)
Potassium: 3.8 mmol/L (ref 3.5–5.2)
Sodium: 140 mmol/L (ref 134–144)
TOTAL PROTEIN: 7 g/dL (ref 6.0–8.5)

## 2019-02-08 LAB — LIPID PANEL
Chol/HDL Ratio: 3.9 ratio (ref 0.0–5.0)
Cholesterol, Total: 197 mg/dL (ref 100–199)
HDL: 50 mg/dL (ref >39)
LDL CALC: 134 mg/dL — AB (ref 0–99)
Triglycerides: 63 mg/dL (ref 0–149)
VLDL Cholesterol Cal: 13 mg/dL (ref 5–40)

## 2019-02-08 LAB — TESTOSTERONE: Testosterone: 557 ng/dL (ref 264–916)

## 2019-02-11 ENCOUNTER — Telehealth: Payer: Self-pay | Admitting: Family Medicine

## 2019-02-11 NOTE — Telephone Encounter (Signed)
Please call pt and let him know about his labs and his referral for cardiology and sleep study Thanks

## 2019-02-12 NOTE — Telephone Encounter (Signed)
Pt was advised [facility] 

## 2019-02-15 ENCOUNTER — Other Ambulatory Visit: Payer: Self-pay

## 2019-02-15 ENCOUNTER — Ambulatory Visit (HOSPITAL_COMMUNITY)
Admission: RE | Admit: 2019-02-15 | Discharge: 2019-02-15 | Disposition: A | Discharge status: Home / Self Care | Payer: 59 | Source: Ambulatory Visit | Attending: Family Medicine | Admitting: Family Medicine

## 2019-02-15 ENCOUNTER — Telehealth: Payer: Self-pay | Admitting: Family Medicine

## 2019-02-15 DIAGNOSIS — I517 Cardiomegaly: Secondary | ICD-10-CM | POA: Diagnosis not present

## 2019-02-15 DIAGNOSIS — Z8249 Family history of ischemic heart disease and other diseases of the circulatory system: Secondary | ICD-10-CM | POA: Insufficient documentation

## 2019-02-15 DIAGNOSIS — E785 Hyperlipidemia, unspecified: Secondary | ICD-10-CM | POA: Diagnosis not present

## 2019-02-15 DIAGNOSIS — R011 Cardiac murmur, unspecified: Secondary | ICD-10-CM | POA: Diagnosis not present

## 2019-02-15 NOTE — Telephone Encounter (Signed)
Pt states issues with getting his meds.  Called pharmacy multiple times and unable to reach.  Called [facility] on [address] and they are trying to get pt's pharmacy benefits info to run his meds

## 2019-02-15 NOTE — Progress Notes (Signed)
  Echocardiogram 2D Echocardiogram has been performed.  [name] M 02/15/2019, 10:40 AM

## 2019-03-20 ENCOUNTER — Other Ambulatory Visit: Payer: Self-pay | Admitting: Family Medicine

## 2019-03-20 DIAGNOSIS — I10 Essential (primary) hypertension: Secondary | ICD-10-CM

## 2019-03-21 ENCOUNTER — Telehealth: Payer: Self-pay | Admitting: Family Medicine

## 2019-03-21 DIAGNOSIS — I10 Essential (primary) hypertension: Secondary | ICD-10-CM

## 2019-03-21 MED ORDER — AMLODIPINE BESYLATE 5 MG PO TABS: 5.0000 mg | ORAL_TABLET | Freq: Every day | ORAL | 3 refills | Status: DC

## 2019-03-21 MED ORDER — LISINOPRIL-HYDROCHLOROTHIAZIDE 20-12.5 MG PO TABS: 1.0000 | ORAL_TABLET | Freq: Every day | ORAL | 3 refills | Status: DC

## 2019-03-21 NOTE — Telephone Encounter (Signed)
Pt needs refills on his BP meds to [facility]

## 2019-04-16 ENCOUNTER — Ambulatory Visit (HOSPITAL_BASED_OUTPATIENT_CLINIC_OR_DEPARTMENT_OTHER): Payer: 59 | Attending: Family Medicine

## 2019-09-19 ENCOUNTER — Telehealth: Payer: Self-pay

## 2019-09-19 NOTE — Telephone Encounter (Signed)
Called pt to see if he had completed his cologaurd . No answer and lvm. Stanley Wilkerson

## 2020-02-24 ENCOUNTER — Encounter: Payer: Self-pay | Admitting: Family Medicine

## 2020-04-20 ENCOUNTER — Other Ambulatory Visit: Payer: Self-pay | Admitting: Family Medicine

## 2020-04-20 DIAGNOSIS — I10 Essential (primary) hypertension: Secondary | ICD-10-CM

## 2020-04-21 ENCOUNTER — Telehealth: Payer: Self-pay | Admitting: Family Medicine

## 2020-04-21 ENCOUNTER — Telehealth: Payer: Self-pay

## 2020-04-21 NOTE — Telephone Encounter (Signed)
cpe made 05/06/20

## 2020-04-21 NOTE — Telephone Encounter (Signed)
Called pt to advise of the need for an appt. Lvm with mother to have him to call back. [place]

## 2020-05-06 ENCOUNTER — Encounter: Payer: 59 | Admitting: Family Medicine

## 2020-05-06 ENCOUNTER — Other Ambulatory Visit: Payer: Self-pay

## 2020-05-06 ENCOUNTER — Encounter: Payer: Self-pay | Admitting: Family Medicine

## 2020-05-06 ENCOUNTER — Ambulatory Visit: Payer: 59 | Admitting: Family Medicine

## 2020-05-06 VITALS — BP 162/92 | HR 76 | Temp 97.8°F | Ht 67.0 in | Wt 279.6 lb

## 2020-05-06 DIAGNOSIS — Z0000 Encounter for general adult medical examination without abnormal findings: Secondary | ICD-10-CM | POA: Diagnosis not present

## 2020-05-06 DIAGNOSIS — Z91199 Patient's noncompliance with other medical treatment and regimen due to unspecified reason: Secondary | ICD-10-CM

## 2020-05-06 DIAGNOSIS — I10 Essential (primary) hypertension: Secondary | ICD-10-CM

## 2020-05-06 DIAGNOSIS — Z1211 Encounter for screening for malignant neoplasm of colon: Secondary | ICD-10-CM

## 2020-05-06 DIAGNOSIS — Z9119 Patient's noncompliance with other medical treatment and regimen: Secondary | ICD-10-CM

## 2020-05-06 DIAGNOSIS — Z8249 Family history of ischemic heart disease and other diseases of the circulatory system: Secondary | ICD-10-CM | POA: Diagnosis not present

## 2020-05-06 DIAGNOSIS — E669 Obesity, unspecified: Secondary | ICD-10-CM | POA: Diagnosis not present

## 2020-05-06 DIAGNOSIS — E785 Hyperlipidemia, unspecified: Secondary | ICD-10-CM

## 2020-05-06 MED ORDER — ATORVASTATIN CALCIUM 20 MG PO TABS: 20.0000 mg | ORAL_TABLET | Freq: Every day | ORAL | 3 refills | Status: DC

## 2020-05-06 MED ORDER — LISINOPRIL-HYDROCHLOROTHIAZIDE 20-12.5 MG PO TABS: 1.0000 | ORAL_TABLET | Freq: Every day | ORAL | 3 refills | Status: DC

## 2020-05-06 MED ORDER — AMLODIPINE BESYLATE 5 MG PO TABS: 5.0000 mg | ORAL_TABLET | Freq: Every day | ORAL | 3 refills | Status: DC

## 2020-05-06 NOTE — Progress Notes (Signed)
   Subjective:    Patient ID: Stanley Wilkerson, male    DOB: 1966/01/14, 54 y.o.   MRN: [id number]  HPI He is here for complete examination.  He has been having some difficulty with both eyes and is presently seeing an eye doctor for this.  He is describing glaucoma but I do not have information concerning that.  He has not been taking his blood pressure medications regularly nor is he taking atorvastatin at present time.  He has concerns over the medications causing different symptoms but never brought him to my attention and stop the medications on his own.  He has no other concerns or complaints.  Family and social history as well as health maintenance and immunizations was reviewed.   Review of Systems  All other systems reviewed and are negative.      Objective:   Physical Exam Alert and in no distress. Tympanic membranes and canals are normal. Pharyngeal area is normal. Neck is supple without adenopathy or thyromegaly. Cardiac exam shows a regular sinus rhythm without murmurs or gallops. Lungs are clear to auscultation. Abdominal exam shows no masses or tenderness.      Assessment & Plan:  Routine general medical examination at a health care facility - Plan: CBC with Differential/Platelet, Comprehensive metabolic panel, Lipid panel  Essential hypertension - Plan: CBC with Differential/Platelet, Comprehensive metabolic panel, amLODipine (NORVASC) 5 MG tablet, lisinopril-hydrochlorothiazide (ZESTORETIC) 20-12.5 MG tablet  Family history of heart disease in male family member before age 80  Obesity (BMI 30-39.9) - Plan: CBC with Differential/Platelet, Comprehensive metabolic panel, Lipid panel  Hyperlipidemia LDL goal <70 - Plan: Lipid panel, atorvastatin (LIPITOR) 20 MG tablet  Screening for colon cancer - Plan: Cologuard  Personal history of noncompliance with medical treatment, presenting hazards to health I discussed Covid with him and all the concerns he has concerning getting  this.  He has a lot of misconceptions about this and I tried to educate him on it. I then discussed the fact that he needs to get back on these medications and if he has concerns or complaints about possible side effects to call me so we can discuss whether it is related to a true side effect or not.  I will have him return here in several months for a recheck.

## 2020-05-07 LAB — LIPID PANEL
Chol/HDL Ratio: 4.6 ratio (ref 0.0–5.0)
Cholesterol, Total: 243 mg/dL — ABNORMAL HIGH (ref 100–199)
HDL: 53 mg/dL (ref >39)
LDL Chol Calc (NIH): 168 mg/dL — ABNORMAL HIGH (ref 0–99)
Triglycerides: 121 mg/dL (ref 0–149)
VLDL Cholesterol Cal: 22 mg/dL (ref 5–40)

## 2020-05-07 LAB — COMPREHENSIVE METABOLIC PANEL
ALT: 21 IU/L (ref 0–44)
AST: 20 IU/L (ref 0–40)
Albumin/Globulin Ratio: 1.6 (ref 1.2–2.2)
Albumin: 4.4 g/dL (ref 3.8–4.9)
Alkaline Phosphatase: 83 IU/L (ref 48–121)
BUN/Creatinine Ratio: 14 (ref 9–20)
BUN: 13 mg/dL (ref 6–24)
Bilirubin Total: 0.8 mg/dL (ref 0.0–1.2)
CO2: 27 mmol/L (ref 20–29)
Calcium: 9 mg/dL (ref 8.7–10.2)
Chloride: 97 mmol/L (ref 96–106)
Creatinine, Ser: 0.91 mg/dL (ref 0.76–1.27)
GFR calc Af Amer: 111 mL/min/{1.73_m2} (ref >59)
GFR calc non Af Amer: 96 mL/min/{1.73_m2} (ref >59)
Globulin, Total: 2.8 g/dL (ref 1.5–4.5)
Glucose: 116 mg/dL — ABNORMAL HIGH (ref 65–99)
Potassium: 3.6 mmol/L (ref 3.5–5.2)
Sodium: 139 mmol/L (ref 134–144)
Total Protein: 7.2 g/dL (ref 6.0–8.5)

## 2020-05-07 LAB — CBC WITH DIFFERENTIAL/PLATELET
Basophils Absolute: 0 10*3/uL (ref 0.0–0.2)
Basos: 1 %
EOS (ABSOLUTE): 0.2 10*3/uL (ref 0.0–0.4)
Eos: 3 %
Hematocrit: 45 % (ref 37.5–51.0)
Hemoglobin: 14.9 g/dL (ref 13.0–17.7)
Immature Grans (Abs): 0 10*3/uL (ref 0.0–0.1)
Immature Granulocytes: 0 %
Lymphocytes Absolute: 1.7 10*3/uL (ref 0.7–3.1)
Lymphs: 24 %
MCH: 28.8 pg (ref 26.6–33.0)
MCHC: 33.1 g/dL (ref 31.5–35.7)
MCV: 87 fL (ref 79–97)
Monocytes Absolute: 0.6 10*3/uL (ref 0.1–0.9)
Monocytes: 9 %
Neutrophils Absolute: 4.4 10*3/uL (ref 1.4–7.0)
Neutrophils: 63 %
Platelets: 285 10*3/uL (ref 150–450)
RBC: 5.18 x10E6/uL (ref 4.14–5.80)
RDW: 13.8 % (ref 11.6–15.4)
WBC: 7 10*3/uL (ref 3.4–10.8)

## 2020-05-07 LAB — SPECIMEN STATUS REPORT

## 2020-05-07 LAB — HGB A1C W/O EAG: Hgb A1c MFr Bld: 6.9 % — ABNORMAL HIGH (ref 4.8–5.6)

## 2020-05-15 LAB — COLOGUARD: Cologuard: POSITIVE — AB

## 2020-05-17 ENCOUNTER — Other Ambulatory Visit: Payer: Self-pay | Admitting: Family Medicine

## 2020-05-17 DIAGNOSIS — I10 Essential (primary) hypertension: Secondary | ICD-10-CM

## 2020-05-21 ENCOUNTER — Encounter: Payer: Self-pay | Admitting: Gastroenterology

## 2020-05-21 ENCOUNTER — Other Ambulatory Visit: Payer: Self-pay

## 2020-05-21 DIAGNOSIS — Z1211 Encounter for screening for malignant neoplasm of colon: Secondary | ICD-10-CM

## 2020-05-21 DIAGNOSIS — R195 Other fecal abnormalities: Secondary | ICD-10-CM

## 2020-05-21 NOTE — Progress Notes (Signed)
Pt was advised. And referral was put in. [facility]

## 2020-05-25 ENCOUNTER — Ambulatory Visit: Payer: Self-pay | Admitting: Family Medicine

## 2020-05-26 ENCOUNTER — Other Ambulatory Visit: Payer: Self-pay

## 2020-05-26 ENCOUNTER — Ambulatory Visit: Payer: 59 | Admitting: Family Medicine

## 2020-05-26 VITALS — BP 160/84 | HR 76 | Temp 97.7°F | Wt 284.8 lb

## 2020-05-26 DIAGNOSIS — E119 Type 2 diabetes mellitus without complications: Secondary | ICD-10-CM | POA: Insufficient documentation

## 2020-05-26 DIAGNOSIS — E118 Type 2 diabetes mellitus with unspecified complications: Secondary | ICD-10-CM | POA: Insufficient documentation

## 2020-05-26 DIAGNOSIS — G4719 Other hypersomnia: Secondary | ICD-10-CM | POA: Diagnosis not present

## 2020-05-26 MED ORDER — ACCU-CHEK SOFTCLIX LANCETS MISC: 1.0000 | Freq: Two times a day (BID) | 3 refills | Status: DC

## 2020-05-26 MED ORDER — GLUCOSE BLOOD VI STRP: 1.0000 | ORAL_STRIP | 3 refills | Status: DC | PRN

## 2020-05-26 NOTE — Progress Notes (Signed)
   Subjective:    Patient ID: Stanley Wilkerson, male    DOB: 1966/01/09, 54 y.o.   MRN: [id number]  HPI He is here to start treatment for diabetes. His A1C was 6.9 Kim worked with him concerning CBG's and gave information on diabetes. I also talked to   him about eating smaller meals, exercise and checking sugars. I briefly discussed foot care,eye care( he has an appt soon) risk of stroke heart disease,kidney failure.  He also has daytime sleepiness and an Epworth of 20  Review of Systems     Objective:   Physical Exam Alert and in no distress       Assessment & Plan:  Excessive daytime sleepiness - Plan: Home sleep test  New onset type 2 diabetes mellitus ([place])  he will follow up in one month to answer any questions. Over 30 minutes spent answering questions

## 2020-06-25 ENCOUNTER — Ambulatory Visit: Payer: 59 | Admitting: Family Medicine

## 2020-06-25 ENCOUNTER — Other Ambulatory Visit: Payer: Self-pay

## 2020-06-25 ENCOUNTER — Encounter: Payer: Self-pay | Admitting: Family Medicine

## 2020-06-25 VITALS — BP 160/90 | HR 71 | Temp 99.0°F | Wt 280.4 lb

## 2020-06-25 DIAGNOSIS — E119 Type 2 diabetes mellitus without complications: Secondary | ICD-10-CM | POA: Diagnosis not present

## 2020-06-25 DIAGNOSIS — I10 Essential (primary) hypertension: Secondary | ICD-10-CM | POA: Diagnosis not present

## 2020-06-25 DIAGNOSIS — G4719 Other hypersomnia: Secondary | ICD-10-CM

## 2020-06-25 MED ORDER — AMLODIPINE BESYLATE 5 MG PO TABS
5.0000 mg | ORAL_TABLET | Freq: Every day | ORAL | 3 refills | Status: DC
Start: 2020-06-25 — End: 2020-07-14

## 2020-06-25 MED ORDER — LOSARTAN POTASSIUM-HCTZ 100-12.5 MG PO TABS: 1.0000 | ORAL_TABLET | Freq: Every day | ORAL | 3 refills | Status: DC

## 2020-06-25 NOTE — Patient Instructions (Signed)
Check your blood sugars needed before you eat or 2 hours after you eat.  2 hours after you eat your blood sugar should be 180 or lower.  Before you eat your blood sugar should be in the low 100s or maybe even lower. Cut back on the carbohydrates and again that some bread, rice, pasta, potatoes and sugar

## 2020-06-25 NOTE — Progress Notes (Signed)
   Subjective:    Patient ID: Stanley Wilkerson, male    DOB: 03-06-1966, 54 y.o.   MRN: [id number]  HPI He is here for a recheck.  He states that he thinks the lisinopril is causing difficulty with his heart rate and he stopped the medication.  He is not interested in going back on this medicine.  He has made some diet changes specifically cutting back on carbohydrates.  He has also been checking his blood sugars and in general they are below 180.  His job keeps him physically active.   Review of Systems     Objective:   Physical Exam Alert and in no distress otherwise not examined blood pressure is recorded.       Assessment & Plan:  Essential hypertension - Plan: losartan-hydrochlorothiazide (HYZAAR) 100-12.5 MG tablet  New onset type 2 diabetes mellitus (H[facility])  Excessive daytime sleepiness I will switch him to losartan and have him stop the other medication.  Again discussed diet and exercise as well is checking blood sugars either before a meal or 2 hours after a meal. He is also scheduled for a sleep study but this will apparently take sometimes due to backlog. Recheck here in 3 months.  15 minutes spent discussing these issues with him.

## 2020-07-14 ENCOUNTER — Encounter: Payer: Self-pay | Admitting: Gastroenterology

## 2020-07-14 ENCOUNTER — Ambulatory Visit (INDEPENDENT_AMBULATORY_CARE_PROVIDER_SITE_OTHER): Payer: 59 | Admitting: Gastroenterology

## 2020-07-14 VITALS — BP 190/84 | HR 90 | Ht 67.0 in | Wt 283.4 lb

## 2020-07-14 DIAGNOSIS — Z01818 Encounter for other preprocedural examination: Secondary | ICD-10-CM

## 2020-07-14 DIAGNOSIS — R195 Other fecal abnormalities: Secondary | ICD-10-CM

## 2020-07-14 DIAGNOSIS — K625 Hemorrhage of anus and rectum: Secondary | ICD-10-CM | POA: Diagnosis not present

## 2020-07-14 MED ORDER — PLENVU 140 G PO SOLR
140.0000 g | ORAL | 0 refills | Status: DC
Start: 2020-07-14 — End: 2020-07-30

## 2020-07-14 NOTE — Patient Instructions (Addendum)
If you are age 54 or older, your body mass index should be between 23-30. Your Body mass index is 44.39 kg/m. If this is out of the aforementioned range listed, please consider follow up with your Primary Care Provider.  If you are age 70 or younger, your body mass index should be between 19-25. Your Body mass index is 44.39 kg/m. If this is out of the aformentioned range listed, please consider follow up with your Primary Care Provider.   You have been scheduled for a colonoscopy. Please follow written instructions given to you at your visit today.  Please pick up your prep supplies at the pharmacy within the next 1-3 days. If you use inhalers (even only as needed), please bring them with you on the day of your procedure.  Due to recent COVID-19 restrictions implemented by [employer] and state authorities and in an effort to keep both patients and staff as safe as possible, [facility] requires COVID-19 testing prior to any scheduled endoscopic procedure. The testing center is located at [address] [place], [place] [address] in the [facility] [employer]  suite.  Your appointment has been scheduled for 9-31-2021 at 830am.   Please bring your insurance cards to this appointment. You will require your COVID screen 2 business days prior to your endoscopic procedure.  You are not required to quarantine after your screening.  You will only receive a phone call with the results if it is POSITIVE.  If you do not receive a call the day before your procedure you should begin your prep, if ordered, and you should report to the endo center for your procedure at your designated appointment arrival time ( one hour prior to the procedure time). There is no cost to you for the screening on the day of the swab.  [facility] will file with your insurance company for the testing.    You may receive an automated phone call prior to your procedure or  have a message in your MyChart that you have an appointment for a BP/15 at the [facility], please disregard this message.  Your testing will be at the [place] [place] location.   If you are leaving [facility] travel [place] on [address], turn left onto [address], turn night onto [address] at the 1st stop light turn right, pass the [employer] on your right and proceed to [address] (white building).   It was a pleasure to see you today!  Dr. Loletha Carrow

## 2020-07-14 NOTE — Progress Notes (Signed)
[facility] Consult Note:  History: Stanley Wilkerson 07/14/2020  Referring provider: Denita Lung, MD  Reason for consult/chief complaint: Cologard positive (By PCP, pt states he has intermittent rectal bleeding. Pt states it was a small amount he has not seen any in over a month. )   Subjective  HPI: Positive Cologuard 05/15/2020  This is a 54 year old man referred by primary care for recent rectal bleeding and a positive Cologuard.  He had 2 or 3 episodes of small-volume painless rectal bleeding with the last couple of months, none for over a month now.  He has limited health literacy, denies abdominal pain, constipation, diarrhea, nausea vomiting or dysphagia. No prior colon cancer screening before this recent Cologuard test.   ROS:  Review of Systems  Constitutional: Negative for appetite change and unexpected weight change.  HENT: Negative for mouth sores and voice change.   Eyes: Negative for pain and redness.  Respiratory: Negative for cough and shortness of breath.   Cardiovascular: Negative for chest pain and palpitations.  Genitourinary: Negative for dysuria and hematuria.  Musculoskeletal: Negative for arthralgias and myalgias.  Skin: Negative for pallor and rash.  Neurological: Negative for weakness and headaches.  Hematological: Negative for adenopathy.     Past Medical History: Past Medical History:  Diagnosis Date  . Hypertension   . Obesity      Past Surgical History: History reviewed. No pertinent surgical history. No prior surgery  Family History: Family History  Problem Relation Age of Onset  . Heart disease Father   No known colorectal cancer  Social History: Social History   Socioeconomic History  . Marital status: Single    Spouse name: Not on file  . Number of children: Not on file  . Years of education: Not on file  . Highest education level: Not on file  Occupational History  . Not on file  Tobacco Use    . Smoking status: Former Research scientist (life sciences)  . Smokeless tobacco: Never Used  Substance and Sexual Activity  . Alcohol use: No  . Drug use: No  . Sexual activity: Not Currently  Other Topics Concern  . Not on file  Social History Narrative   Exercise with walking at times, more in the summer with mowing.  Unemployed.  Stays with mother.              Social Determinants of Health   Financial Resource Strain:   . Difficulty of Paying Living Expenses:   Food Insecurity:   . Worried About Charity fundraiser in the Last Year:   . Arboriculturist in the Last Year:   Transportation Needs:   . Film/video editor (Medical):   Marland Kitchen Lack of Transportation (Non-Medical):   Physical Activity:   . Days of Exercise per Week:   . Minutes of Exercise per Session:   Stress:   . Feeling of Stress :   Social Connections:   . Frequency of Communication with Friends and Family:   . Frequency of Social Gatherings with Friends and Family:   . Attends Religious Services:   . Active Member of Clubs or Organizations:   . Attends Archivist Meetings:   Marland Kitchen Marital Status:    He lives with his sister and mother and works in [employer] of a [employer]olic[employer].  Allergies: Allergies  Allergen Reactions  . Penicillins     Outpatient Meds: Current Outpatient Medications  Medication Sig Dispense Refill  . Accu-Chek  Softclix Lancets lancets 1 each by Other route 2 (two) times daily at 8 am and 10 pm. Use as instructed 100 each 3  . atorvastatin (LIPITOR) 20 MG tablet Take 1 tablet (20 mg total) by mouth daily. 90 tablet 3  . glucose blood test strip 1 each by Other route as needed for other. Use as instructed to check blood sugar daily 100 each 3  . lisinopril-hydrochlorothiazide (ZESTORETIC) 20-12.5 MG tablet Take 1 tablet by mouth once daily 90 tablet 0  . losartan-hydrochlorothiazide (HYZAAR) 100-12.5 MG tablet Take 1 tablet by mouth daily. 90 tablet 3  . traMADol (ULTRAM) 50 MG tablet  Take 1 tablet (50 mg total) by mouth every 12 (twelve) hours as needed for moderate pain. 15 tablet 0   No current facility-administered medications for this visit.      ___________________________________________________________________ Objective   Exam:  BP (!) 190/84   Pulse 90   Ht 5\' 7"  (1.702 m)   Wt 283 lb 6.4 oz (128.5 kg)   BMI 44.39 kg/m    General: Not acutely ill-appearing.  Eyes: sclera anicteric, no redness  ENT: oral mucosa moist without lesions, no cervical or supraclavicular lymphadenopathy  CV: RRR with a soft systolic murmur, M6/Q9, no appreciable JVD, mild pretibial edema left greater than right  Resp: clear to auscultation bilaterally, normal RR and effort noted  GI: soft, no tenderness, with active bowel sounds.  Cannot assess mass or hepatosplenomegaly due to body habitus.  Skin; warm and dry, no rash or jaundice noted  Neuro: awake, alert and oriented x 3. Normal gross motor function and fluent speech  Labs:  CBC Latest Ref Rng & Units 05/06/2020 02/07/2019 03/26/2017  WBC 3.4 - 10.8 x10E3/uL 7.0 7.8 -  Hemoglobin 13.0 - 17.7 g/dL 14.9 14.5 13.6  Hematocrit 37.5 - 51.0 % 45.0 42.1 40.0  Platelets 150 - 450 x10E3/uL 285 285 -   CMP Latest Ref Rng & Units 05/06/2020 02/07/2019 03/26/2017  Glucose 65 - 99 mg/dL 116(H) 101(H) 113(H)  BUN 6 - 24 mg/dL 13 14 18   Creatinine 0.76 - 1.27 mg/dL 0.91 1.04 0.90  Sodium 134 - 144 mmol/L 139 140 139  Potassium 3.5 - 5.2 mmol/L 3.6 3.8 3.3(L)  Chloride 96 - 106 mmol/L 97 98 97(L)  CO2 20 - 29 mmol/L 27 23 -  Calcium 8.7 - 10.2 mg/dL 9.0 8.9 -  Total Protein 6.0 - 8.5 g/dL 7.2 7.0 -  Total Bilirubin 0.0 - 1.2 mg/dL 0.8 0.6 -  Alkaline Phos 48 - 121 IU/L 83 65 -  AST 0 - 40 IU/L 20 19 -  ALT 0 - 44 IU/L 21 18 -    Assessment: Encounter Diagnoses  Name Primary?  . Positive colorectal cancer screening using Cologuard test Yes  . Rectal bleeding     Few recent episodes of painless rectal bleeding,  positive Cologuard around the same time.  I explained that a positive Cologuard may be from occult blood, 1 or more precancerous polyps or colon cancer.  Colonoscopy is therefore indicated, and I explained that procedure and its preparation in detail along with risks and benefits.  All his questions were answered.  The benefits and risks of the planned procedure were described in detail with the patient or (when appropriate) their health care proxy.  Risks were outlined as including, but not limited to, bleeding, infection, perforation, adverse medication reaction leading to cardiac or pulmonary decompensation, pancreatitis (if ERCP).  The limitation of incomplete mucosal visualization was also  discussed.  No guarantees or warranties were given.  His blood pressure is under suboptimal control, and I suspect primary care is aware but we will certainly forward my note to them as well.  Primary care notes indicate that there is a history of noncompliance with this patient.  Plan:  Colonoscopy  Thank you for the courtesy of this consult.  Please call me with any questions or concerns.  Nelida Meuse III  CC: Referring provider noted above

## 2020-07-23 HISTORY — PX: CATARACT EXTRACTION: SUR2

## 2020-07-29 ENCOUNTER — Telehealth: Payer: Self-pay | Admitting: Gastroenterology

## 2020-07-29 ENCOUNTER — Ambulatory Visit (INDEPENDENT_AMBULATORY_CARE_PROVIDER_SITE_OTHER): Payer: 59

## 2020-07-29 DIAGNOSIS — R195 Other fecal abnormalities: Secondary | ICD-10-CM

## 2020-07-29 DIAGNOSIS — Z01818 Encounter for other preprocedural examination: Secondary | ICD-10-CM

## 2020-07-29 DIAGNOSIS — Z1159 Encounter for screening for other viral diseases: Secondary | ICD-10-CM

## 2020-07-29 LAB — SARS CORONAVIRUS 2 (TAT 6-24 HRS): SARS Coronavirus 2: NEGATIVE

## 2020-07-29 NOTE — Telephone Encounter (Signed)
Patient called me this morning to discuss insurance questions.  He asked me about time and date that was on his AVS.  There are incorrect dates and time on it, and he continued to tell me that he didn't know the exact times to do the prep.  I looked in his chart and didn't see any instructions printed.  He needs a call back right away.

## 2020-07-29 NOTE — Telephone Encounter (Signed)
Pt called and said he does not have a his instruction but it also looks like he no showed for his covid test yesterday.  Please confirm

## 2020-07-29 NOTE — Telephone Encounter (Signed)
Colonoscopy has been cancelled due to pre-screening colon has no been completed. Left a voicemail for Nicole Kindred asking him to call back back and reschedule.

## 2020-07-29 NOTE — Telephone Encounter (Signed)
I had spoken to pt before [name] left him a message.  He is scheduled for a covid test today at 12:30 pm- address given and he is aware that he cannot have procedure tomorrow if he doesn't go for covid test today. Understanding voiced.  Also, prep instructions printed; pt states he cannot find paperwork.  I left that at the front desk on the 4th floor and told him he must pick those up today. Emphasized need to stay on clear liquids.  Understanding voiced

## 2020-07-30 ENCOUNTER — Encounter: Payer: 59 | Admitting: Gastroenterology

## 2020-07-30 ENCOUNTER — Other Ambulatory Visit: Payer: Self-pay

## 2020-07-30 ENCOUNTER — Ambulatory Visit (AMBULATORY_SURGERY_CENTER): Payer: 59 | Admitting: Gastroenterology

## 2020-07-30 ENCOUNTER — Encounter: Payer: Self-pay | Admitting: Gastroenterology

## 2020-07-30 VITALS — BP 174/91 | HR 70 | Temp 98.9°F | Resp 25 | Ht 67.0 in | Wt 283.0 lb

## 2020-07-30 DIAGNOSIS — D123 Benign neoplasm of transverse colon: Secondary | ICD-10-CM

## 2020-07-30 DIAGNOSIS — K5730 Diverticulosis of large intestine without perforation or abscess without bleeding: Secondary | ICD-10-CM

## 2020-07-30 DIAGNOSIS — R195 Other fecal abnormalities: Secondary | ICD-10-CM

## 2020-07-30 DIAGNOSIS — K648 Other hemorrhoids: Secondary | ICD-10-CM

## 2020-07-30 MED ORDER — SODIUM CHLORIDE 0.9 % IV SOLN
500.0000 mL | Freq: Once | INTRAVENOUS | Status: DC
Start: 2020-07-30 — End: 2020-07-30

## 2020-07-30 NOTE — Progress Notes (Signed)
No problems noted in the recovery room. maw 

## 2020-07-30 NOTE — Progress Notes (Signed)
To PACU, VSS. Report to [email or web address] 

## 2020-07-30 NOTE — Patient Instructions (Addendum)
Handouts were given to you on polyps, diverticulosis, and hemorrhoids. Your blood sugar was 117 in the recovery room. You may resume your current medications today. Await biopsy results. Please call if any questions or concerns.      YOU HAD AN ENDOSCOPIC PROCEDURE TODAY AT [facility]:   Refer to the procedure report that was given to you for any specific questions about what was found during the examination.  If the procedure report does not answer your questions, please call your gastroenterologist to clarify.  If you requested that your care partner not be given the details of your procedure findings, then the procedure report has been included in a sealed envelope for you to review at your convenience later.  YOU SHOULD EXPECT: Some feelings of bloating in the abdomen. Passage of more gas than usual.  Walking can help get rid of the air that was put into your GI tract during the procedure and reduce the bloating. If you had a lower endoscopy (such as a colonoscopy or flexible sigmoidoscopy) you may notice spotting of blood in your stool or on the toilet paper. If you underwent a bowel prep for your procedure, you may not have a normal bowel movement for a few days.  Please Note:  You might notice some irritation and congestion in your nose or some drainage.  This is from the oxygen used during your procedure.  There is no need for concern and it should clear up in a day or so.  SYMPTOMS TO REPORT IMMEDIATELY:   Following lower endoscopy (colonoscopy or flexible sigmoidoscopy):  Excessive amounts of blood in the stool  Significant tenderness or worsening of abdominal pains  Swelling of the abdomen that is new, acute  Fever of 100F or higher   For urgent or emergent issues, a gastroenterologist can be reached at any hour by calling [phone]. Do not use MyChart messaging for urgent concerns.    DIET:  We do recommend a small meal at first, but then you may  proceed to your regular diet.  Drink plenty of fluids but you should avoid alcoholic beverages for 24 hours.  ACTIVITY:  You should plan to take it easy for the rest of today and you should NOT DRIVE or use heavy machinery until tomorrow (because of the sedation medicines used during the test).    FOLLOW UP: Our staff will call the number listed on your records 48-72 hours following your procedure to check on you and address any questions or concerns that you may have regarding the information given to you following your procedure. If we do not reach you, we will leave a message.  We will attempt to reach you two times.  During this call, we will ask if you have developed any symptoms of COVID 19. If you develop any symptoms (ie: fever, flu-like symptoms, shortness of breath, cough etc.) before then, please call [phone].  If you test positive for Covid 19 in the 2 weeks post procedure, please call and report this information to [place].    If any biopsies were taken you will be contacted by phone or by letter within the next 1-3 weeks.  Please call us at [phone] if you have not heard about the biopsies in 3 weeks.    SIGNATURES/CONFIDENTIALITY: You and/or your care partner have signed paperwork which will be entered into your electronic medical record.  These signatures attest to the fact that that the information above on your After Visit  Summary has been reviewed and is understood.  Full responsibility of the confidentiality of this discharge information lies with you and/or your care-partner.

## 2020-07-30 NOTE — Progress Notes (Signed)
Called to room to assist during endoscopic procedure.  Patient ID and intended procedure confirmed with present staff. Received instructions for my participation in the procedure from the performing physician.  

## 2020-07-30 NOTE — Progress Notes (Signed)
Pt's states no medical or surgical changes since previsit or office visit. 

## 2020-07-30 NOTE — Op Note (Signed)
[place] Patient Name: Stanley Wilkerson Procedure Date: 07/30/2020 8:45 AM MRN: [id number] Endoscopist: Mallie Mussel L. Loletha Carrow , MD Age: 54 Referring MD:  Date of Birth: May 21, 1966 Gender: Male Account #: [id number] Procedure:                Colonoscopy Indications:              Positive Cologuard test Medicines:                Monitored Anesthesia Care Procedure:                Pre-Anesthesia Assessment:                           - Prior to the procedure, a History and Physical                            was performed, and patient medications and                            allergies were reviewed. The patient's tolerance of                            previous anesthesia was also reviewed. The risks                            and benefits of the procedure and the sedation                            options and risks were discussed with the patient.                            All questions were answered, and informed consent                            was obtained. Prior Anticoagulants: The patient has                            taken no previous anticoagulant or antiplatelet                            agents. ASA Grade Assessment: III - A patient with                            severe systemic disease. After reviewing the risks                            and benefits, the patient was deemed in                            satisfactory condition to undergo the procedure.                           After obtaining informed consent, the colonoscope  was passed under direct vision. Throughout the                            procedure, the patient's blood pressure, pulse, and                            oxygen saturations were monitored continuously. The                            Colonoscope was introduced through the anus and                            advanced to the the cecum, identified by                            appendiceal orifice and ileocecal valve. The                             colonoscopy was performed without difficulty. The                            patient tolerated the procedure well. The quality                            of the bowel preparation was good. The ileocecal                            valve, appendiceal orifice, and rectum were                            photographed. The bowel preparation used was Plenvu. Scope In: 8:55:42 AM Scope Out: 9:10:45 AM Scope Withdrawal Time: 0 hours 12 minutes 47 seconds  Total Procedure Duration: 0 hours 15 minutes 3 seconds  Findings:                 The perianal and digital rectal examinations were                            normal.                           A 4 mm polyp was found in the transverse colon. The                            polyp was sessile. The polyp was removed with a                            cold snare. Resection and retrieval were complete.                           Multiple diverticula were found in the left colon                            and right colon.  Internal hemorrhoids were found.                           The exam was otherwise without abnormality on                            direct and retroflexion views. Complications:            No immediate complications. Estimated Blood Loss:     Estimated blood loss was minimal. Impression:               - One 4 mm polyp in the transverse colon, removed                            with a cold snare. Resected and retrieved.                           - Diverticulosis in the left colon and in the right                            colon.                           - Internal hemorrhoids.                           - The examination was otherwise normal on direct                            and retroflexion views. Recommendation:           - Patient has a contact number available for                            emergencies. The signs and symptoms of potential                            delayed  complications were discussed with the                            patient. Return to normal activities tomorrow.                            Written discharge instructions were provided to the                            patient.                           - Resume previous diet.                           - Continue present medications.                           - Await pathology results.                           -  Repeat colonoscopy is recommended for                            surveillance. The colonoscopy date will be                            determined after pathology results from today's                            exam become available for review. Andilyn Bettcher L. Loletha Carrow, MD 07/30/2020 9:14:48 AM This report has been signed electronically.

## 2020-08-04 ENCOUNTER — Telehealth: Payer: Self-pay | Admitting: *Deleted

## 2020-08-04 NOTE — Telephone Encounter (Signed)
  Follow up Call-  Call back number 07/30/2020  Post procedure Call Back phone  # [phone]  Permission to leave phone message Yes  Some recent data might be hidden     Patient questions:  Do you have a fever, pain , or abdominal swelling? No. Pain Score  0 *  Have you tolerated food without any problems? Yes.    Have you been able to return to your normal activities? Yes.    Do you have any questions about your discharge instructions: Diet   No. Medications  No. Follow up visit  No.  Do you have questions or concerns about your Care? No.  Actions: * If pain score is 4 or above: No action needed, pain <4.  1. Have you developed a fever since your procedure? no  2.   Have you had an respiratory symptoms (SOB or cough) since your procedure? no  3.   Have you tested positive for COVID 19 since your procedure no  4.   Have you had any family members/close contacts diagnosed with the COVID 19 since your procedure?  no   If yes to any of these questions please route to Joylene John, RN and Joella Prince, RN

## 2020-08-06 ENCOUNTER — Encounter: Payer: Self-pay | Admitting: Gastroenterology

## 2020-08-17 ENCOUNTER — Ambulatory Visit: Payer: 59 | Admitting: Family Medicine

## 2020-08-17 ENCOUNTER — Other Ambulatory Visit: Payer: Self-pay

## 2020-08-17 ENCOUNTER — Other Ambulatory Visit (INDEPENDENT_AMBULATORY_CARE_PROVIDER_SITE_OTHER): Payer: 59

## 2020-08-17 VITALS — Wt 276.0 lb

## 2020-08-17 DIAGNOSIS — Z20822 Contact with and (suspected) exposure to covid-19: Secondary | ICD-10-CM

## 2020-08-17 DIAGNOSIS — U071 COVID-19: Secondary | ICD-10-CM

## 2020-08-17 LAB — POCT INFLUENZA A/B
Influenza A, POC: NEGATIVE
Influenza B, POC: NEGATIVE

## 2020-08-17 LAB — POC COVID19 BINAXNOW: SARS Coronavirus 2 Ag: POSITIVE — AB

## 2020-08-17 NOTE — Progress Notes (Signed)
° °  Subjective:    Patient ID: Stanley Wilkerson, male    DOB: 1966-10-03, 54 y.o.   MRN: [id number]  HPI I connected with  Stanley Wilkerson on 08/17/20 by a video enabled telemedicine application and verified that I am speaking with the correct person using two identifiers.  caregility used I discussed the limitations of evaluation and management by telemedicine. The patient expressed understanding and agreed to proceed. He was exposed to Covid by his brother and mother.  He started having chest and nasal congestion several days ago but no fever, chills, cough, sore throat.  He had a test done today which was positive.   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  I am fine with him working but did explain that he needs to stay away from anybody for at least the next 10 days and if his symptoms specifically fever, chills, cough and congestion get worse, he needs let me know.  He was comfortable with that.  Approximately 11 minutes spent discussing this with him.

## 2020-08-18 ENCOUNTER — Telehealth: Payer: Self-pay | Admitting: Family Medicine

## 2020-08-18 NOTE — Telephone Encounter (Signed)
Pt called and wants the antibody infusion.

## 2020-08-18 NOTE — Telephone Encounter (Signed)
Let him know that I did not mention it because he was not having much in the way of symptoms but you can go ahead and set him up for that if you would like.  He would qualify based on his weight

## 2020-08-18 NOTE — Telephone Encounter (Signed)
Called Infusion clinic [phone] left message to set up patient.  Called patient advised him of same.

## 2020-08-19 ENCOUNTER — Telehealth: Payer: Self-pay | Admitting: Unknown Physician Specialty

## 2020-08-19 ENCOUNTER — Other Ambulatory Visit: Payer: Self-pay | Admitting: Unknown Physician Specialty

## 2020-08-19 DIAGNOSIS — U071 COVID-19: Secondary | ICD-10-CM

## 2020-08-19 DIAGNOSIS — E669 Obesity, unspecified: Secondary | ICD-10-CM

## 2020-08-19 NOTE — Telephone Encounter (Signed)
I connected by phone with Stanley Wilkerson on 08/19/2020 at 3:48 PM to discuss the potential use of a new treatment for mild to moderate COVID-19 viral infection in non-hospitalized patients.  This patient is a 54 y.o. male that meets the FDA criteria for Emergency Use Authorization of COVID monoclonal antibody casirivimab/imdevimab.  Has a (+) direct SARS-CoV-2 viral test result  Has mild or moderate COVID-19   Is NOT hospitalized due to COVID-19  Is within 10 days of symptom onset  Has at least one of the high risk factor(s) for progression to severe COVID-19 and/or hospitalization as defined in EUA.  Specific high risk criteria : BMI > 25   I have spoken and communicated the following to the patient or parent/caregiver regarding COVID monoclonal antibody treatment:  1. FDA has authorized the emergency use for the treatment of mild to moderate COVID-19 in adults and pediatric patients with positive results of direct SARS-CoV-2 viral testing who are 52 years of age and older weighing at least 40 kg, and who are at high risk for progressing to severe COVID-19 and/or hospitalization.  2. The significant known and potential risks and benefits of COVID monoclonal antibody, and the extent to which such potential risks and benefits are unknown.  3. Information on available alternative treatments and the risks and benefits of those alternatives, including clinical trials.  4. Patients treated with COVID monoclonal antibody should continue to self-isolate and use infection control measures (e.g., wear mask, isolate, social distance, avoid sharing personal items, clean and disinfect "high touch" surfaces, and frequent handwashing) according to CDC guidelines.   5. The patient or parent/caregiver has the option to accept or refuse COVID monoclonal antibody treatment.  After reviewing this information with the patient, The patient agreed to proceed with receiving casirivimab\imdevimab infusion and  will be provided a copy of the Fact sheet prior to receiving the infusion.   [name] 08/19/2020 3:48 PM  Sx onset 9/16

## 2020-08-20 ENCOUNTER — Ambulatory Visit (HOSPITAL_COMMUNITY)
Admission: RE | Admit: 2020-08-20 | Discharge: 2020-08-20 | Disposition: A | Discharge status: Home / Self Care | Payer: 59 | Source: Ambulatory Visit | Attending: Pulmonary Disease | Admitting: Pulmonary Disease

## 2020-08-20 DIAGNOSIS — U071 COVID-19: Secondary | ICD-10-CM

## 2020-08-20 DIAGNOSIS — E669 Obesity, unspecified: Secondary | ICD-10-CM | POA: Insufficient documentation

## 2020-08-20 LAB — NOVEL CORONAVIRUS, NAA: SARS-CoV-2, NAA: DETECTED — AB

## 2020-08-20 MED ORDER — SODIUM CHLORIDE 0.9 % IV SOLN: INTRAVENOUS | Status: DC | PRN

## 2020-08-20 MED ORDER — ALBUTEROL SULFATE HFA 108 (90 BASE) MCG/ACT IN AERS: 2.0000 | INHALATION_SPRAY | Freq: Once | RESPIRATORY_TRACT | Status: DC | PRN

## 2020-08-20 MED ORDER — EPINEPHRINE 0.3 MG/0.3ML IJ SOAJ: 0.3000 mg | Freq: Once | INTRAMUSCULAR | Status: DC | PRN

## 2020-08-20 MED ORDER — DIPHENHYDRAMINE HCL 50 MG/ML IJ SOLN: 50.0000 mg | Freq: Once | INTRAMUSCULAR | Status: DC | PRN

## 2020-08-20 MED ORDER — SODIUM CHLORIDE 0.9 % IV SOLN
1200.0000 mg | Freq: Once | INTRAVENOUS | Status: AC
  Administered 2020-08-20: 1200 mg via INTRAVENOUS

## 2020-08-20 MED ORDER — METHYLPREDNISOLONE SODIUM SUCC 125 MG IJ SOLR: 125.0000 mg | Freq: Once | INTRAMUSCULAR | Status: DC | PRN

## 2020-08-20 MED ORDER — FAMOTIDINE IN NACL 20-0.9 MG/50ML-% IV SOLN: 20.0000 mg | Freq: Once | INTRAVENOUS | Status: DC | PRN

## 2020-08-20 NOTE — Progress Notes (Signed)
  Diagnosis: COVID-19  Physician: Dr. Joya Gaskins  Procedure: [facility] Med: casirivimab\imdevimab infusion - Provided patient with casirivimab\imdevimab fact sheet for patients, parents and caregivers prior to infusion.  Complications: No immediate complications noted.  Discharge: Discharged home   Stanley Wilkerson 08/20/2020

## 2020-08-20 NOTE — Discharge Instructions (Signed)

## 2020-09-25 ENCOUNTER — Other Ambulatory Visit: Payer: Self-pay

## 2020-09-25 ENCOUNTER — Encounter: Payer: Self-pay | Admitting: Family Medicine

## 2020-09-25 ENCOUNTER — Ambulatory Visit: Payer: 59 | Admitting: Family Medicine

## 2020-09-25 VITALS — BP 160/94 | HR 59 | Temp 98.4°F | Wt 269.0 lb

## 2020-09-25 DIAGNOSIS — E669 Obesity, unspecified: Secondary | ICD-10-CM

## 2020-09-25 DIAGNOSIS — G4719 Other hypersomnia: Secondary | ICD-10-CM | POA: Diagnosis not present

## 2020-09-25 DIAGNOSIS — E1169 Type 2 diabetes mellitus with other specified complication: Secondary | ICD-10-CM

## 2020-09-25 DIAGNOSIS — I10 Essential (primary) hypertension: Secondary | ICD-10-CM | POA: Diagnosis not present

## 2020-09-25 DIAGNOSIS — Z8616 Personal history of COVID-19: Secondary | ICD-10-CM

## 2020-09-25 LAB — POCT GLYCOSYLATED HEMOGLOBIN (HGB A1C): Hemoglobin A1C: 6.8 % — AB (ref 4.0–5.6)

## 2020-09-25 NOTE — Patient Instructions (Signed)
Take melatonin at roughly 6 mg nightly,regularly

## 2020-09-25 NOTE — Progress Notes (Signed)
  Subjective:    Patient ID: Stanley Wilkerson, male    DOB: [birth year], 54 y.o.   MRN: [id number]  Stanley Wilkerson is a 54 y.o. male who presents for follow-up of Type 2 diabetes mellitus.  Home blood sugar records none  current symptoms/problems include none and have been unchanged. Daily foot checks:   Any foot concerns:no Exercise: The patient does not participate in regular exercise at present. Diet: Regular.  He continues on Hyzaar and is having no difficulty with that.  He is also taking Lipitor without aches or pains.  He is not checking his blood sugars regularly.  Due to the pandemic we were not able to do a sleep study and will therefore we order it. The following portions of the patient's history were reviewed and updated as appropriate: allergies, current medications, past medical history, past social history and problem list.  ROS as in subjective above.     Objective:    Physical Exam Alert and in no distress otherwise not examined. Hemoglobin A1c is 6.8.  Lab Review Diabetic Labs Latest Ref Rng & Units 05/06/2020 02/07/2019 03/26/2017 08/26/2014  HbA1c 4.8 - 5.6 % 6.9(H) - - -  Chol 100 - 199 mg/dL 243(H) 197 - 232(H)  HDL >39 mg/dL 53 50 - 39(L)  Calc LDL 0 - 99 mg/dL 168(H) 134(H) - 122(H)  Triglycerides 0 - 149 mg/dL 121 63 - 354(H)  Creatinine 0.76 - 1.27 mg/dL 0.91 1.04 0.90 0.99   BP/Weight 08/20/2020 08/17/2020 07/30/2020 07/14/2020 03/13/6062  Systolic BP 016 - 010 932 355  Diastolic BP 75 - 91 84 90  Wt. (Lbs) - 276 283 283.4 280.4  BMI - 43.23 44.32 44.39 43.92   No flowsheet data found.  Stanley Wilkerson  reports that he has quit smoking. He has never used smokeless tobacco. He reports that he does not drink alcohol and does not use drugs.     Assessment & Plan:    Type 2 diabetes mellitus with other specified complication, without long-term current use of insulin (HCC) - Plan: Amb Referral to Nutrition and Diabetic E, POCT glycosylated hemoglobin (Hb A1C)  Obesity  (BMI 30-39.9) - Plan: Home sleep test  Essential hypertension - Plan: Amb Referral to Nutrition and Diabetic E  Excessive daytime sleepiness - Plan: Home sleep test  History of COVID-19   1. Rx changes: none 2. Education: Reviewed 'ABCs' of diabetes management (respective goals in parentheses):  A1C (<7), blood pressure (<130/80), and cholesterol (LDL <100). 3. Compliance at present is estimated to be fair. Efforts to improve compliance (if necessary) will be directed at dietary modifications: Cut back on carbohydrates. 4. Follow up: 4 months I had a long discussion with him concerning Covid and follow-up immunizations however he is not interested.

## 2020-10-12 ENCOUNTER — Telehealth: Payer: Self-pay

## 2020-10-12 NOTE — Telephone Encounter (Signed)
Pt states he hasn't heard anything about the sleep study and would like to know something.  He still is having lots of issues with not sleeping, gasping for air at night and choking and he is scared to take the melatonin thinking it may knock him out and then he might might get choked while sleeping and not wake up.  He can't lay down to sleep at all so I don't know if a at home sleep study is even going to work for this patient.  He really needs to be watched and see the difficulty he is having to get any sleep at all at night.

## 2020-10-13 ENCOUNTER — Telehealth: Payer: Self-pay

## 2020-10-13 NOTE — Telephone Encounter (Signed)
The sleep center has to get it approved , which can take a while. They will reach out to the pt as soon as this is taken care of.

## 2020-10-13 NOTE — Telephone Encounter (Signed)
Please cosign pt sleep study they will not begin working on the sleep study without this [facility]

## 2020-11-28 ENCOUNTER — Emergency Department (HOSPITAL_COMMUNITY): Payer: 59

## 2020-11-28 ENCOUNTER — Observation Stay (HOSPITAL_COMMUNITY)
Admission: EM | Admit: 2020-11-28 | Discharge: 2020-11-29 | Disposition: A | Discharge status: Home / Self Care | Payer: 59 | Attending: Cardiology | Admitting: Cardiology

## 2020-11-28 ENCOUNTER — Encounter (HOSPITAL_COMMUNITY): Payer: Self-pay | Admitting: Emergency Medicine

## 2020-11-28 ENCOUNTER — Other Ambulatory Visit: Payer: Self-pay

## 2020-11-28 DIAGNOSIS — Z79899 Other long term (current) drug therapy: Secondary | ICD-10-CM | POA: Diagnosis not present

## 2020-11-28 DIAGNOSIS — I119 Hypertensive heart disease without heart failure: Secondary | ICD-10-CM | POA: Diagnosis not present

## 2020-11-28 DIAGNOSIS — R079 Chest pain, unspecified: Secondary | ICD-10-CM | POA: Diagnosis present

## 2020-11-28 DIAGNOSIS — I4891 Unspecified atrial fibrillation: Secondary | ICD-10-CM | POA: Diagnosis not present

## 2020-11-28 DIAGNOSIS — Z20822 Contact with and (suspected) exposure to covid-19: Secondary | ICD-10-CM | POA: Insufficient documentation

## 2020-11-28 DIAGNOSIS — Z87891 Personal history of nicotine dependence: Secondary | ICD-10-CM | POA: Diagnosis not present

## 2020-11-28 DIAGNOSIS — R748 Abnormal levels of other serum enzymes: Secondary | ICD-10-CM | POA: Insufficient documentation

## 2020-11-28 DIAGNOSIS — R778 Other specified abnormalities of plasma proteins: Secondary | ICD-10-CM

## 2020-11-28 DIAGNOSIS — E119 Type 2 diabetes mellitus without complications: Secondary | ICD-10-CM | POA: Diagnosis not present

## 2020-11-28 DIAGNOSIS — I10 Essential (primary) hypertension: Secondary | ICD-10-CM | POA: Insufficient documentation

## 2020-11-28 LAB — BASIC METABOLIC PANEL
Anion gap: 10 (ref 5–15)
BUN: 14 mg/dL (ref 6–20)
CO2: 29 mmol/L (ref 22–32)
Calcium: 9 mg/dL (ref 8.9–10.3)
Chloride: 98 mmol/L (ref 98–111)
Creatinine, Ser: 1.11 mg/dL (ref 0.61–1.24)
GFR, Estimated: >60 mL/min (ref >60)
Glucose, Bld: 147 mg/dL — ABNORMAL HIGH (ref 70–99)
Potassium: 3.1 mmol/L — ABNORMAL LOW (ref 3.5–5.1)
Sodium: 137 mmol/L (ref 135–145)

## 2020-11-28 LAB — CBC
HCT: 43.4 % (ref 39.0–52.0)
Hemoglobin: 15.1 g/dL (ref 13.0–17.0)
MCH: 30.4 pg (ref 26.0–34.0)
MCHC: 34.8 g/dL (ref 30.0–36.0)
MCV: 87.5 fL (ref 80.0–100.0)
Platelets: 286 10*3/uL (ref 150–400)
RBC: 4.96 MIL/uL (ref 4.22–5.81)
RDW: 13.2 % (ref 11.5–15.5)
WBC: 9.1 10*3/uL (ref 4.0–10.5)
nRBC: 0 % (ref 0.0–0.2)

## 2020-11-28 LAB — RESP PANEL BY RT-PCR (FLU A&B, COVID) ARPGX2
Influenza A by PCR: NEGATIVE
Influenza B by PCR: NEGATIVE
SARS Coronavirus 2 by RT PCR: NEGATIVE

## 2020-11-28 LAB — TROPONIN I (HIGH SENSITIVITY)
Troponin I (High Sensitivity): 39 ng/L — ABNORMAL HIGH (ref <18)
Troponin I (High Sensitivity): 46 ng/L — ABNORMAL HIGH (ref <18)

## 2020-11-28 MED ORDER — HEPARIN (PORCINE) 25000 UT/250ML-% IV SOLN
1600.0000 [IU]/h | INTRAVENOUS | Status: DC
  Administered 2020-11-28: 23:00:00 1400 [IU]/h via INTRAVENOUS
  Filled 2020-11-28 (×2): qty 250

## 2020-11-28 MED ORDER — METOPROLOL TARTRATE 12.5 MG HALF TABLET
12.5000 mg | ORAL_TABLET | Freq: Two times a day (BID) | ORAL | Status: DC
  Administered 2020-11-29 (×2): 12.5 mg via ORAL
  Filled 2020-11-28 (×2): qty 1

## 2020-11-28 MED ORDER — AMIODARONE HCL IN DEXTROSE 360-4.14 MG/200ML-% IV SOLN
30.0000 mg/h | INTRAVENOUS | Status: DC
  Administered 2020-11-29: 30 mg/h via INTRAVENOUS
  Filled 2020-11-28: qty 200

## 2020-11-28 MED ORDER — ACETAMINOPHEN 325 MG PO TABS: 650.0000 mg | ORAL_TABLET | ORAL | Status: DC | PRN

## 2020-11-28 MED ORDER — ASPIRIN 81 MG PO CHEW
324.0000 mg | CHEWABLE_TABLET | ORAL | Status: DC
  Filled 2020-11-28: qty 4

## 2020-11-28 MED ORDER — AMIODARONE LOAD VIA INFUSION
150.0000 mg | Freq: Once | INTRAVENOUS | Status: AC
  Administered 2020-11-29: 150 mg via INTRAVENOUS
  Filled 2020-11-28: qty 83.34

## 2020-11-28 MED ORDER — HEPARIN (PORCINE) 25000 UT/250ML-% IV SOLN: 16.0000 [IU]/kg/h | INTRAVENOUS | Status: DC

## 2020-11-28 MED ORDER — SODIUM CHLORIDE 0.9 % IV SOLN: INTRAVENOUS | Status: DC

## 2020-11-28 MED ORDER — POTASSIUM CHLORIDE 20 MEQ PO PACK
40.0000 meq | PACK | Freq: Every day | ORAL | Status: DC
  Administered 2020-11-28: 40 meq via ORAL
  Filled 2020-11-28: qty 2

## 2020-11-28 MED ORDER — INSULIN ASPART 100 UNIT/ML ~~LOC~~ SOLN: 0.0000 [IU] | Freq: Three times a day (TID) | SUBCUTANEOUS | Status: DC

## 2020-11-28 MED ORDER — LOSARTAN POTASSIUM 25 MG PO TABS
25.0000 mg | ORAL_TABLET | Freq: Every day | ORAL | Status: DC
  Administered 2020-11-29: 25 mg via ORAL
  Filled 2020-11-28: qty 1

## 2020-11-28 MED ORDER — HEPARIN BOLUS VIA INFUSION
5000.0000 [IU] | Freq: Once | INTRAVENOUS | Status: AC
  Administered 2020-11-28: 5000 [IU] via INTRAVENOUS
  Filled 2020-11-28: qty 5000

## 2020-11-28 MED ORDER — ATORVASTATIN CALCIUM 80 MG PO TABS
80.0000 mg | ORAL_TABLET | Freq: Every day | ORAL | Status: DC
  Administered 2020-11-29: 80 mg via ORAL
  Filled 2020-11-28: qty 1

## 2020-11-28 MED ORDER — POTASSIUM CHLORIDE CRYS ER 20 MEQ PO TBCR
40.0000 meq | EXTENDED_RELEASE_TABLET | Freq: Once | ORAL | Status: AC
  Administered 2020-11-29: 40 meq via ORAL
  Filled 2020-11-28: qty 2

## 2020-11-28 MED ORDER — NITROGLYCERIN 0.4 MG SL SUBL: 0.4000 mg | SUBLINGUAL_TABLET | SUBLINGUAL | Status: DC | PRN

## 2020-11-28 MED ORDER — ASPIRIN EC 81 MG PO TBEC
81.0000 mg | DELAYED_RELEASE_TABLET | Freq: Every day | ORAL | Status: DC
  Administered 2020-11-29: 81 mg via ORAL
  Filled 2020-11-28: qty 1

## 2020-11-28 MED ORDER — AMIODARONE HCL IN DEXTROSE 360-4.14 MG/200ML-% IV SOLN
60.0000 mg/h | INTRAVENOUS | Status: AC
  Administered 2020-11-29: 60 mg/h via INTRAVENOUS
  Filled 2020-11-28: qty 200

## 2020-11-28 MED ORDER — HEPARIN BOLUS VIA INFUSION
4000.0000 [IU] | Freq: Once | INTRAVENOUS | Status: DC
  Filled 2020-11-28: qty 4000

## 2020-11-28 MED ORDER — HEPARIN SODIUM (PORCINE) 5000 UNIT/ML IJ SOLN: 4000.0000 [IU] | Freq: Once | INTRAMUSCULAR | Status: DC

## 2020-11-28 MED ORDER — ASPIRIN 300 MG RE SUPP: 300.0000 mg | RECTAL | Status: DC

## 2020-11-28 MED ORDER — ASPIRIN 81 MG PO CHEW
324.0000 mg | CHEWABLE_TABLET | Freq: Once | ORAL | Status: AC
  Administered 2020-11-28: 324 mg via ORAL
  Filled 2020-11-28: qty 4

## 2020-11-28 MED ORDER — ONDANSETRON HCL 4 MG/2ML IJ SOLN: 4.0000 mg | Freq: Four times a day (QID) | INTRAMUSCULAR | Status: DC | PRN

## 2020-11-28 NOTE — ED Notes (Signed)
Paged Skotnicki for MD

## 2020-11-28 NOTE — Progress Notes (Signed)
ANTICOAGULATION CONSULT NOTE  Pharmacy Consult for Heparin Indication: chest pain/ACS  Allergies  Allergen Reactions  . Penicillins Other (See Comments)    From childhood- reaction not recalled    Patient Measurements: Height: 5\' 7"  (170.2 cm) Weight: 123.8 kg (272 lb 14.9 oz) IBW/kg (Calculated) : 66.1 Heparin Dosing Weight: 95 kg  Vital Signs: Temp: 98.9 F (37.2 C) (01/01 1832) Temp Source: Oral (01/01 1832) BP: 144/92 (01/01 2030) Pulse Rate: 90 (01/01 2030)  Labs: Recent Labs    11/28/20 1843 11/28/20 1932  HGB 15.1  --   HCT 43.4  --   PLT 286  --   CREATININE 1.11  --   TROPONINIHS  --  39*    Estimated Creatinine Clearance: 96 mL/min (by C-G formula based on SCr of 1.11 mg/dL).   Medical History: Past Medical History:  Diagnosis Date  . Hypertension   . Obesity     Medications:  Scheduled:  . heparin  4,000 Units Intravenous Once  . potassium chloride  40 mEq Oral Daily    Assessment: Patient is a 77 yom that presents to the ED with CP. Patient was found to be in afib as well has having an elevated trop. Pharmacy has been asked to dose heparin at this time for  Afib.   Goal of Therapy:  Heparin level 0.3-0.7 units/ml Monitor platelets by anticoagulation protocol: Yes   Plan:  - Heparin bolus 5000 units IV x 1 dose - Heparin drip @ 1400 units/hr - Heparin level in ~ 6 hours  - Monitor patient for s/s of bleeding and CBC while on heparin   57 PharmD. BCPS  11/28/2020,10:03 PM

## 2020-11-28 NOTE — H&P (Signed)
Stanley Wilkerson is an 55 y.o. male.   Chief Complaint: Sudden onset of fluttering in the chest HPI: Patient is 55 year old male with past medical history significant for hypertension, hypertensive heart disease with mild diastolic dysfunction, hyperlipidemia, new onset diabetes mellitus, morbid obesity, probable sleep apnea, history of COVID-19 infection in September, has not been vaccinated, family history of coronary artery disease father and grandfather died of MI in their 37s, came to ER complaining of sudden onset of fluttering in the chest since this afternoon associated with feeling weak tired and fatigued.  States have been getting occasional skipping and fluttering in the chest which lasts for few minutes but today has the palpitations did heart get resolved so decided to come to ED.  Patient denies any chest pain nausea vomiting diaphoresis.  Denies PND orthopnea leg swelling.  Denies any history of exertional chest pain.  Denies any history of thyroid problems or history of rheumatic fever as child.  EKG done in the ED showed A. fib with moderate ventricular response with ST-T wave changes in lateral leads which are old.  High-sensitivity troponin I was minimally elevated.  Past Medical History:  Diagnosis Date  . Hypertension   . Obesity     Past Surgical History:  Procedure Laterality Date  . CATARACT EXTRACTION Right 07/23/2020    Family History  Problem Relation Age of Onset  . Heart disease Father    Social History:  reports that he has quit smoking. He has never used smokeless tobacco. He reports that he does not drink alcohol and does not use drugs.  Allergies:  Allergies  Allergen Reactions  . Penicillins Other (See Comments)    From childhood- reaction not recalled    (Not in a hospital admission)   Results for orders placed or performed during the hospital encounter of 11/28/20 (from the past 48 hour(s))  Basic metabolic panel     Status: Abnormal   Collection  Time: 11/28/20  6:43 PM  Result Value Ref Range   Sodium 137 135 - 145 mmol/L   Potassium 3.1 (L) 3.5 - 5.1 mmol/L   Chloride 98 98 - 111 mmol/L   CO2 29 22 - 32 mmol/L   Glucose, Bld 147 (H) 70 - 99 mg/dL    Comment: Glucose reference range applies only to samples taken after fasting for at least 8 hours.   BUN 14 6 - 20 mg/dL   Creatinine, Ser 5.57 0.61 - 1.24 mg/dL   Calcium 9.0 8.9 - 32.2 mg/dL   GFR, Estimated >02 >54 mL/min    Comment: (NOTE) Calculated using the CKD-EPI Creatinine Equation (2021)    Anion gap 10 5 - 15    Comment: Performed at [facility], [address] [place], [place] [address]  CBC     Status: None   Collection Time: 11/28/20  6:43 PM  Result Value Ref Range   WBC 9.1 4.0 - 10.5 K/uL   RBC 4.96 4.22 - 5.81 MIL/uL   Hemoglobin 15.1 13.0 - 17.0 g/dL   HCT 37.6 28.3 - 15.1 %   MCV 87.5 80.0 - 100.0 fL   MCH 30.4 26.0 - 34.0 pg   MCHC 34.8 30.0 - 36.0 g/dL   RDW 76.1 60.7 - 37.1 %   Platelets 286 150 - 400 K/uL   nRBC 0.0 0.0 - 0.2 %    Comment: Performed at [facility], [address], [place], [place] [address]  Troponin I (High Sensitivity)  Status: Abnormal   Collection Time: 11/28/20  7:32 PM  Result Value Ref Range   Troponin I (High Sensitivity) 39 (H) <18 ng/L    Comment: (NOTE) Elevated high sensitivity troponin I (hsTnI) values and significant  changes across serial measurements may suggest ACS but many other  chronic and acute conditions are known to elevate hsTnI results.  Refer to the "Links" section for chest pain algorithms and additional  guidance. Performed at [facility], [place] [address], [place], [place] [address]    DG Chest 2 View  Result Date: 11/28/2020 CLINICAL DATA:  Atrial fibrillation EXAM: CHEST - 2 VIEW COMPARISON:  03/26/2017 FINDINGS: The heart size and mediastinal contours are within normal limits. Coarsened interstitial markings bilaterally, most pronounced within the bibasilar regions. No  focal airspace consolidation. No pleural effusion or pneumothorax. The visualized skeletal structures are unremarkable. IMPRESSION: Coarsened interstitial markings bilaterally, most pronounced within the bibasilar regions, suspicious for bronchitic lung changes. Electronically Signed   By: Davina Poke D.O.   On: 11/28/2020 18:54    Review of Systems  Constitutional: Positive for fatigue. Negative for chills, diaphoresis and fever.  HENT: Negative for sore throat.   Eyes: Negative for itching.  Respiratory: Negative for cough, chest tightness and shortness of breath.   Cardiovascular: Positive for palpitations. Negative for chest pain and leg swelling.  Gastrointestinal: Negative for abdominal pain.  Genitourinary: Negative for difficulty urinating.  Neurological: Negative for dizziness.    Blood pressure (!) 144/92, pulse 90, temperature 98.9 F (37.2 C), temperature source Oral, resp. rate 11, height 5\' 7"  (1.702 m), weight 123.8 kg, SpO2 96 %. Physical Exam Constitutional:      Appearance: Normal appearance.  HENT:     Head: Normocephalic and atraumatic.  Eyes:     Extraocular Movements: Extraocular movements intact.     Conjunctiva/sclera: Conjunctivae normal.     Pupils: Pupils are equal, round, and reactive to light.  Neck:     Vascular: No carotid bruit.  Cardiovascular:     Rate and Rhythm: Normal rate. Rhythm irregular.  Pulmonary:     Effort: Pulmonary effort is normal. No respiratory distress.     Breath sounds: Normal breath sounds. No wheezing, rhonchi or rales.  Abdominal:     General: Bowel sounds are normal. There is distension.     Palpations: Abdomen is soft.     Tenderness: There is no abdominal tenderness. There is no rebound.  Musculoskeletal:        General: No swelling or tenderness.     Cervical back: Normal range of motion and neck supple. No rigidity.  Skin:    General: Skin is warm and dry.  Neurological:     General: No focal deficit present.      Mental Status: He is alert.      Assessment/Plan New onset A. fib with moderate ventricular response Minimally elevated high-sensitivity troponin I secondary to above doubt significant MI Hypertensive heart disease with mild diastolic dysfunction Hyperlipidemia New onset diabetes mellitus Morbid obesity Probable obstructive sleep apnea Positive family history of coronary artery disease Plan As per orders  Charolette Forward, MD 11/28/2020, 10:17 PM

## 2020-11-28 NOTE — ED Provider Notes (Signed)
[facility] Provider Note   CSN: [id number] Arrival date & time: 11/28/20  1814     History Chief Complaint  Patient presents with  . Atrial Fibrillation    Stanley Wilkerson is a 55 y.o. male.  Patient presents ER chief complaint of chest pain and palpitations.  Symptoms began earlier today and have been persistent.  Patient states he had similar episodes in the past but the never lasted this long.  Nothing seems to make it better.  Denies any fevers or cough.  No vomiting or diarrhea.  Patient does not see a cardiologist regularly.        Past Medical History:  Diagnosis Date  . Hypertension   . Obesity     Patient Active Problem List   Diagnosis Date Noted  . New onset type 2 diabetes mellitus ([place]) 05/26/2020  . Family history of heart disease in male family member before age 86 12/01/2014  . Hyperlipidemia LDL goal <70 12/01/2014  . LVH (left ventricular hypertrophy) 08/26/2014  . Obesity (BMI 30-39.9) 08/26/2014  . Essential hypertension 08/26/2014    Past Surgical History:  Procedure Laterality Date  . CATARACT EXTRACTION Right 07/23/2020       Family History  Problem Relation Age of Onset  . Heart disease Father     Social History   Tobacco Use  . Smoking status: Former Research scientist (life sciences)  . Smokeless tobacco: Never Used  Substance Use Topics  . Alcohol use: No  . Drug use: No    Home Medications Prior to Admission medications   Medication Sig Start Date End Date Taking? Authorizing Provider  dorzolamide-timolol (COSOPT) 22.3-6.8 MG/ML ophthalmic solution Place 1 drop into both eyes 2 (two) times daily.   Yes [provider]  losartan-hydrochlorothiazide (HYZAAR) 100-12.5 MG tablet Take 1 tablet by mouth daily. Patient taking differently: Take 1 tablet by mouth 2 (two) times a week. 06/25/20  Yes Denita Lung, MD  Accu-Chek Softclix Lancets lancets 1 each by Other route 2 (two) times daily at 8 am and 10 pm.  Use as instructed 05/26/20   Denita Lung, MD  atorvastatin (LIPITOR) 20 MG tablet Take 1 tablet (20 mg total) by mouth daily. Patient not taking: No sig reported 05/06/20 05/06/21  Denita Lung, MD  glucose blood test strip 1 each by Other route as needed for other. Use as instructed to check blood sugar daily 05/26/20   Denita Lung, MD  traMADol (ULTRAM) 50 MG tablet Take 1 tablet (50 mg total) by mouth every 12 (twelve) hours as needed for moderate pain. Patient not taking: No sig reported 03/26/17   Domenic Moras, PA-C    Allergies    Penicillins  Review of Systems   Review of Systems  Constitutional: Negative for fever.  HENT: Negative for ear pain and sore throat.   Eyes: Negative for pain.  Respiratory: Negative for cough.   Cardiovascular: Positive for chest pain and palpitations.  Gastrointestinal: Negative for abdominal pain.  Genitourinary: Negative for flank pain.  Musculoskeletal: Negative for back pain.  Skin: Negative for color change and rash.  Neurological: Negative for syncope.  All other systems reviewed and are negative.   Physical Exam Updated Vital Signs BP (!) 144/92   Pulse 90   Temp 98.9 F (37.2 C) (Oral)   Resp 11   Ht 5\' 7"  (1.702 m)   Wt 123.8 kg   SpO2 96%   BMI 42.75 kg/m   Physical Exam  Constitutional:      General: He is not in acute distress.    Appearance: He is well-developed.  HENT:     Head: Normocephalic.     Nose: Nose normal.  Eyes:     Extraocular Movements: Extraocular movements intact.  Cardiovascular:     Rate and Rhythm: Tachycardia present. Rhythm irregular.  Pulmonary:     Effort: Pulmonary effort is normal.  Skin:    Coloration: Skin is not jaundiced.  Neurological:     Mental Status: He is alert. Mental status is at baseline.     ED Results / Procedures / Treatments   Labs (all labs ordered are listed, but only abnormal results are displayed) Labs Reviewed  BASIC METABOLIC PANEL - Abnormal; Notable for  the following components:      Result Value   Potassium 3.1 (*)    Glucose, Bld 147 (*)    All other components within normal limits  TROPONIN I (HIGH SENSITIVITY) - Abnormal; Notable for the following components:   Troponin I (High Sensitivity) 39 (*)    All other components within normal limits  RESP PANEL BY RT-PCR (FLU A&B, COVID) ARPGX2  CBC  HEPARIN LEVEL (UNFRACTIONATED)  CBC  TROPONIN I (HIGH SENSITIVITY)    EKG EKG Interpretation  Date/Time:  Saturday November 28 2020 18:16:16 EST Ventricular Rate:  108 PR Interval:    QRS Duration: 98 QT Interval:  318 QTC Calculation: 426 R Axis:   56 Text Interpretation: Atrial fibrillation with rapid ventricular response ST & T wave abnormality, consider lateral ischemia Abnormal ECG Confirmed by Norman Clay ([id number]) on 11/28/2020 7:03:22 PM   Radiology DG Chest 2 View  Result Date: 11/28/2020 CLINICAL DATA:  Atrial fibrillation EXAM: CHEST - 2 VIEW COMPARISON:  03/26/2017 FINDINGS: The heart size and mediastinal contours are within normal limits. Coarsened interstitial markings bilaterally, most pronounced within the bibasilar regions. No focal airspace consolidation. No pleural effusion or pneumothorax. The visualized skeletal structures are unremarkable. IMPRESSION: Coarsened interstitial markings bilaterally, most pronounced within the bibasilar regions, suspicious for bronchitic lung changes. Electronically Signed   By: Duanne Guess D.O.   On: 11/28/2020 18:54    Procedures .Critical Care Performed by: Cheryll Cockayne, MD Authorized by: Cheryll Cockayne, MD   Critical care provider statement:    Critical care time (minutes):  40   Critical care time was exclusive of:  Separately billable procedures and treating other patients and teaching time   Critical care was necessary to treat or prevent imminent or life-threatening deterioration of the following conditions:  Cardiac failure Comments:     Atrial fibrillation with rapid  ventricular rate.  Elevated troponin.   (including critical care time)  Medications Ordered in ED Medications  potassium chloride (KLOR-CON) packet 40 mEq (40 mEq Oral Given 11/28/20 2107)  heparin bolus via infusion 4,000 Units (has no administration in time range)  heparin ADULT infusion 100 units/mL (25000 units/234mL) (has no administration in time range)  aspirin chewable tablet 324 mg (324 mg Oral Given 11/28/20 2107)    ED Course  I have reviewed the triage vital signs and the nursing notes.  Pertinent labs & imaging results that were available during my care of the patient were reviewed by me and considered in my medical decision making (see chart for details).    MDM Rules/Calculators/A&P                          EKG shows  tachycardic rate atrial fibrillation.  Patient appears to be in new onset A. fib.  Troponin was sent and is elevated at 39.  Case discussed with on-call cardiology Dr.Harwani, recommend the patient be brought into the hospital.  Recommending heparin bolus which was initiated.   Final Clinical Impression(s) / ED Diagnoses Final diagnoses:  Atrial fibrillation with RVR ([place])  Elevated troponin    Rx / DC Orders ED Discharge Orders    None       Luna Fuse, MD 11/28/20 2202

## 2020-11-28 NOTE — Progress Notes (Signed)
  Amiodarone Drug - Drug Interaction Consult Note  Recommendations: -No changes recommended  Amiodarone is metabolized by the cytochrome P450 system and therefore has the potential to cause many drug interactions. Amiodarone has an average plasma half-life of 50 days (range 20 to 100 days).   There is potential for drug interactions to occur several weeks or months after stopping treatment and the onset of drug interactions may be slow after initiating amiodarone.   []  Statins: Increased risk of myopathy. Simvastatin- restrict dose to 20mg  daily. Other statins: counsel patients to report any muscle pain or weakness immediately.  []  Anticoagulants: Amiodarone can increase anticoagulant effect. Consider warfarin dose reduction. Patients should be monitored closely and the dose of anticoagulant altered accordingly, remembering that amiodarone levels take several weeks to stabilize.  []  Antiepileptics: Amiodarone can increase plasma concentration of phenytoin, the dose should be reduced. Note that small changes in phenytoin dose can result in large changes in levels. Monitor patient and counsel on signs of toxicity.  []  Beta blockers: increased risk of bradycardia, AV block and myocardial depression. Sotalol - avoid concomitant use.  []   Calcium channel blockers (diltiazem and verapamil): increased risk of bradycardia, AV block and myocardial depression.  []   Cyclosporine: Amiodarone increases levels of cyclosporine. Reduced dose of cyclosporine is recommended.  []  Digoxin dose should be halved when amiodarone is started.  []  Diuretics: increased risk of cardiotoxicity if hypokalemia occurs.  []  Oral hypoglycemic agents (glyburide, glipizide, glimepiride): increased risk of hypoglycemia. Patient's glucose levels should be monitored closely when initiating amiodarone therapy.   []  Drugs that prolong the QT interval:  Torsades de pointes risk may be increased with concurrent use - avoid if  possible.  Monitor QTc, also keep magnesium/potassium WNL if concurrent therapy can't be avoided. Antibiotics: e.g. fluoroquinolones, erythromycin. . Antiarrhythmics: e.g. quinidine, procainamide, disopyramide, sotalol. . Antipsychotics: e.g. phenothiazines, haloperidol.  . Lithium, tricyclic antidepressants, and methadone. Thank You,   , PharmD Clinical Pharmacist **Pharmacist phone directory can now be found on [email or web address] ([email or web address]).  Listed under [facility].

## 2020-11-28 NOTE — ED Triage Notes (Signed)
Pt c/o a fluttering in his chest that started suddenly today while sitting. Denies chest pain and shortness of breath, but states he has difficulty lying flat.

## 2020-11-28 NOTE — ED Notes (Signed)
Pt falling asleep, 02 sat=85% on room air. 2LNC applied.

## 2020-11-29 ENCOUNTER — Observation Stay (HOSPITAL_COMMUNITY): Payer: 59

## 2020-11-29 DIAGNOSIS — Z20822 Contact with and (suspected) exposure to covid-19: Secondary | ICD-10-CM | POA: Diagnosis not present

## 2020-11-29 DIAGNOSIS — R748 Abnormal levels of other serum enzymes: Secondary | ICD-10-CM | POA: Diagnosis not present

## 2020-11-29 DIAGNOSIS — I4891 Unspecified atrial fibrillation: Secondary | ICD-10-CM | POA: Diagnosis not present

## 2020-11-29 DIAGNOSIS — E119 Type 2 diabetes mellitus without complications: Secondary | ICD-10-CM | POA: Diagnosis not present

## 2020-11-29 LAB — BASIC METABOLIC PANEL
Anion gap: 13 (ref 5–15)
BUN: 14 mg/dL (ref 6–20)
CO2: 22 mmol/L (ref 22–32)
Calcium: 8.4 mg/dL — ABNORMAL LOW (ref 8.9–10.3)
Chloride: 100 mmol/L (ref 98–111)
Creatinine, Ser: 0.92 mg/dL (ref 0.61–1.24)
GFR, Estimated: >60 mL/min (ref >60)
Glucose, Bld: 151 mg/dL — ABNORMAL HIGH (ref 70–99)
Potassium: 3.2 mmol/L — ABNORMAL LOW (ref 3.5–5.1)
Sodium: 135 mmol/L (ref 135–145)

## 2020-11-29 LAB — TROPONIN I (HIGH SENSITIVITY)
Troponin I (High Sensitivity): 42 ng/L — ABNORMAL HIGH (ref <18)
Troponin I (High Sensitivity): 32 ng/L — ABNORMAL HIGH (ref <18)

## 2020-11-29 LAB — CBC
HCT: 41.7 % (ref 39.0–52.0)
Hemoglobin: 14.7 g/dL (ref 13.0–17.0)
MCH: 30.4 pg (ref 26.0–34.0)
MCHC: 35.3 g/dL (ref 30.0–36.0)
MCV: 86.3 fL (ref 80.0–100.0)
Platelets: 284 10*3/uL (ref 150–400)
RBC: 4.83 MIL/uL (ref 4.22–5.81)
RDW: 13.2 % (ref 11.5–15.5)
WBC: 7.3 10*3/uL (ref 4.0–10.5)
nRBC: 0 % (ref 0.0–0.2)

## 2020-11-29 LAB — LIPID PANEL
Cholesterol: 194 mg/dL (ref 0–200)
HDL: 41 mg/dL (ref >40)
LDL Cholesterol: 133 mg/dL — ABNORMAL HIGH (ref 0–99)
Total CHOL/HDL Ratio: 4.7 RATIO
Triglycerides: 102 mg/dL (ref <150)
VLDL: 20 mg/dL (ref 0–40)

## 2020-11-29 LAB — HIV ANTIBODY (ROUTINE TESTING W REFLEX): HIV Screen 4th Generation wRfx: NONREACTIVE

## 2020-11-29 LAB — HEPARIN LEVEL (UNFRACTIONATED): Heparin Unfractionated: 0.1 IU/mL — ABNORMAL LOW (ref 0.30–0.70)

## 2020-11-29 LAB — GLUCOSE, CAPILLARY
Glucose-Capillary: 190 mg/dL — ABNORMAL HIGH (ref 70–99)
Glucose-Capillary: 125 mg/dL — ABNORMAL HIGH (ref 70–99)
Glucose-Capillary: 153 mg/dL — ABNORMAL HIGH (ref 70–99)

## 2020-11-29 LAB — ECHOCARDIOGRAM COMPLETE
Area-P 1/2: 3.12 cm2
Height: 67 in
S' Lateral: 4 cm
Weight: 4222.25 oz

## 2020-11-29 LAB — HEMOGLOBIN A1C
Hgb A1c MFr Bld: 6.2 % — ABNORMAL HIGH (ref 4.8–5.6)
Mean Plasma Glucose: 131.24 mg/dL

## 2020-11-29 LAB — MAGNESIUM: Magnesium: 2.2 mg/dL (ref 1.7–2.4)

## 2020-11-29 LAB — TSH: TSH: 0.979 u[IU]/mL (ref 0.350–4.500)

## 2020-11-29 LAB — MRSA PCR SCREENING: MRSA by PCR: NEGATIVE

## 2020-11-29 MED ORDER — HEPARIN BOLUS VIA INFUSION
2000.0000 [IU] | Freq: Once | INTRAVENOUS | Status: AC
  Administered 2020-11-29: 2000 [IU] via INTRAVENOUS
  Filled 2020-11-29: qty 2000

## 2020-11-29 MED ORDER — AMIODARONE HCL 200 MG PO TABS
200.0000 mg | ORAL_TABLET | Freq: Every day | ORAL | Status: DC
  Administered 2020-11-29: 200 mg via ORAL
  Filled 2020-11-29: qty 1

## 2020-11-29 MED ORDER — LOSARTAN POTASSIUM 25 MG PO TABS: 25.0000 mg | ORAL_TABLET | Freq: Every day | ORAL | 3 refills | Status: DC

## 2020-11-29 MED ORDER — POTASSIUM CHLORIDE CRYS ER 20 MEQ PO TBCR
40.0000 meq | EXTENDED_RELEASE_TABLET | Freq: Once | ORAL | Status: AC
  Administered 2020-11-29: 40 meq via ORAL
  Filled 2020-11-29: qty 2

## 2020-11-29 MED ORDER — HYDROCORTISONE 1 % EX OINT
TOPICAL_OINTMENT | Freq: Three times a day (TID) | CUTANEOUS | Status: DC | PRN
  Filled 2020-11-29: qty 28

## 2020-11-29 MED ORDER — NITROGLYCERIN 0.4 MG SL SUBL: 0.4000 mg | SUBLINGUAL_TABLET | SUBLINGUAL | 12 refills | Status: DC | PRN

## 2020-11-29 MED ORDER — METOPROLOL SUCCINATE ER 25 MG PO TB24: 25.0000 mg | ORAL_TABLET | Freq: Every day | ORAL | 11 refills | Status: DC

## 2020-11-29 MED ORDER — AMIODARONE HCL 200 MG PO TABS: 200.0000 mg | ORAL_TABLET | Freq: Every day | ORAL | 3 refills | Status: DC

## 2020-11-29 MED ORDER — APIXABAN 5 MG PO TABS
5.0000 mg | ORAL_TABLET | Freq: Once | ORAL | Status: AC
  Administered 2020-11-29: 5 mg via ORAL
  Filled 2020-11-29: qty 1

## 2020-11-29 MED ORDER — APIXABAN 5 MG PO TABS: 5.0000 mg | ORAL_TABLET | Freq: Two times a day (BID) | ORAL | 3 refills | Status: DC

## 2020-11-29 MED ORDER — ATORVASTATIN CALCIUM 80 MG PO TABS: 80.0000 mg | ORAL_TABLET | Freq: Every day | ORAL | 3 refills | Status: DC

## 2020-11-29 NOTE — Discharge Summary (Signed)
NAME: Stanley Wilkerson, Stanley Wilkerson MEDICAL RECORD [id number] ACCOUNT [id number] DATE OF BIRTH:1966-01-21 FACILITY: [facility] LOCATION: [facility] PHYSICIAN:Drayson Dorko Daivd Council, MD  DISCHARGE SUMMARY  DATE OF DISCHARGE:  11/29/2020  ADMITTING DIAGNOSES: 1.  New-onset atrial fibrillation with moderate ventricular response. 2.  Minimally elevated high sensitivity troponin I secondary to above.  Doubt significant myocardial infarction. 3.  Hypertensive heart disease with mild diastolic dysfunction. 4.  Hypertension. 5.  Hyperlipidemia. 6.  New-onset diabetes mellitus, controlled by diet. 7.  Morbid obesity. 8.  Probable obstructive sleep apnea. 9.  Positive family history of coronary artery disease.  DISCHARGE DIAGNOSES: 1.  Status post new-onset atrial fibrillation with moderate ventricular response, converted back to normal sinus rhythm. 2.  Minimally elevated high sensitivity troponin I secondary to above, trending down. 3.  Hypertensive heart disease with diastolic dysfunction. 4.  Hypertension. 5.  Hyperlipidemia. 6.  New-onset diabetes mellitus, controlled by diet. 7.  Morbid obesity. 8.  Probable obstructive sleep apnea. 9.  Positive family history of coronary artery disease.  DISCHARGE HOME MEDICATIONS: 1.  Amiodarone 200 mg 1 tablet daily. 2.  Eliquis 5 mg 1 tablet twice daily. 3.  Losartan 25 mg 1 tablet daily. 4.  Metoprolol succinate 25 mg daily. 5.  Nitrostat 0.4 mg sublingual p.r.n. 6.  Dorzolamide/timolol eye drops as before. 7.  Atorvastatin 80 mg 1 tablet daily.  DIET:  Low salt, low cholesterol, 1800 calories ADA weight reducing diet.  DISCHARGE INSTRUCTIONS:  The patient has been advised to monitor blood pressure and blood sugar regularly.  CONDITION AT DISCHARGE:  Stable.  FOLLOWUP:  Follow up with me in 1 week.  Follow up with PMD as scheduled.  The patient will be scheduled for sleep studies as outpatient as planned as before.  CONDITION AT DISCHARGE:  Stable.  BRIEF  HISTORY AND HOSPITAL COURSE:  The patient is a 55 year old male with past medical history significant for hypertension, hypertensive heart disease with mild diastolic dysfunction, hyperlipidemia, new-onset diabetes mellitus, morbid obesity,  probable obstructive sleep apnea, history of COVID-19 infection in September.  The patient has not been vaccinated.  Family history of coronary artery disease.  Father and grandfather died of MI in their 66s.  He came to the ER complaining of sudden  onset of fluttering in the chest this afternoon associated with feeling weak, tired, and fatigued.  The patient states he has been getting occasional skipping and fluttering in the chest, which lasts for a few minutes, but today as palpitation did not  get resolved so decided to come to the ED.  The patient denies any chest pain, nausea, vomiting, or diaphoresis.  Denies PND, orthopnea, or leg swelling.  Denies any history of exertional chest pain.  Denies history of thyroid problems or history of  rheumatic fever as a child.  EKG done in the ED showed AFib with moderate ventricular response with ST-T wave changes in lateral leads, which were old.  High sensitivity troponin I was minimally elevated.  PHYSICAL EXAMINATION: GENERAL:  He was alert, awake, oriented x3. VITAL SIGNS:  Blood pressure was 144/92, pulse 90.  He was afebrile. HEENT:  Conjunctivae pink. NECK:  Supple.  No JVD, no bruit. LUNGS:  Clear to auscultation without rhonchi or rales. CARDIOVASCULAR:  Irregularly irregular.  S1, S2 was normal. ABDOMEN:  Soft.  Bowel sounds are present.  Mildly distended, nontender. EXTREMITIES:  There is no clubbing, cyanosis, or edema.  LABORATORY DATA:  Sodium was 137, potassium 3.1, which has been replaced.  Repeat potassium is 3.2.  The patient has received further potassium replacement.  Glucose was 147, BUN was 14, creatinine 1.11.  Hemoglobin was 15.1, hematocrit 43.4, white count  of 9.1.  His high sensitivity  troponin I was 39, 46, 42, and 32.  Total cholesterol was 194, LDL was elevated at 133, HDL 41, triglycerides were 102.  Hemoglobin A1c was 6.2.  TSH was 0.979.  SARS coronavirus PCR was negative.  Influenza A and B PCR  were also negative.  HIV screen was nonreactive.  Initial EKG showed AFib with RVR, ST-T wave changes in the lateral leads.  Repeat EKG done this morning showed normal sinus rhythm with LVH with strain pattern versus lateral ischemia, which is old.   Minimally prolonged QT interval.  2D echo done showed normal LV systolic function with mild LVH and grade II diastolic dysfunction, trivial MR and TR was noted.  BRIEF HOSPITAL COURSE:  The patient was admitted to cardiac telemetry unit.  MI was ruled out by serial enzymes and EKG.  The patient was started on IV heparin and IV amiodarone.  The patient subsequently converted back to sinus rhythm and has remained  in sinus rhythm.  The patient denies any chest pain.  States he feels much better.  His potassium has been low, which has been replaced.  The patient was discussed at length regarding lifestyle changes, compliance with medication and followup, also  discussed regarding getting the sleep studies as scheduled as outpatient as soon as possible.  The patient will be discharged home on above medications and will be followed up in my office in 1 week and followup with his PMD as scheduled as outpatient.  IN/NUANCE D:11/29/2020 T:11/29/2020 JOB:[id number]

## 2020-11-29 NOTE — Discharge Summary (Signed)
Discharge summary dictated on 11/29/2020 dictation number is [phone]

## 2020-11-29 NOTE — Discharge Instructions (Signed)
Atrial Fibrillation  Atrial fibrillation is a type of heartbeat that is irregular or fast. If you have this condition, your heart beats without any order. This makes it hard for your heart to pump blood in a normal way. Atrial fibrillation may come and go, or it may become a long-lasting problem. If this condition is not treated, it can put you at higher risk for stroke, heart failure, and other heart problems. What are the causes? This condition may be caused by diseases that damage the heart. They include:  High blood pressure.  Heart failure.  Heart valve disease.  Heart surgery. Other causes include:  Diabetes.  Thyroid disease.  Being overweight.  Kidney disease. Sometimes the cause is not known. What increases the risk? You are more likely to develop this condition if:  You are older.  You smoke.  You exercise often and very hard.  You have a family history of this condition.  You are a man.  You use drugs.  You drink a lot of alcohol.  You have lung conditions, such as emphysema, pneumonia, or COPD.  You have sleep apnea. What are the signs or symptoms? Common symptoms of this condition include:  A feeling that your heart is beating very fast.  Chest pain or discomfort.  Feeling short of breath.  Suddenly feeling light-headed or weak.  Getting tired easily during activity.  Fainting.  Sweating. In some cases, there are no symptoms. How is this treated? Treatment for this condition depends on underlying conditions and how you feel when you have atrial fibrillation. They include:  Medicines to: ? Prevent blood clots. ? Treat heart rate or heart rhythm problems.  Using devices, such as a pacemaker, to correct heart rhythm problems.  Doing surgery to remove the part of the heart that sends bad signals.  Closing an area where clots can form in the heart (left atrial appendage). In some cases, your doctor will treat other underlying  conditions. Follow these instructions at home: Medicines  Take over-the-counter and prescription medicines only as told by your doctor.  Do not take any new medicines without first talking to your doctor.  If you are taking blood thinners: ? Talk with your doctor before you take any medicines that have aspirin or NSAIDs, such as ibuprofen, in them. ? Take your medicine exactly as told by your doctor. Take it at the same time each day. ? Avoid activities that could hurt or bruise you. Follow instructions about how to prevent falls. ? Wear a bracelet that says you are taking blood thinners. Or, carry a card that lists what medicines you take. Lifestyle      Do not use any products that have nicotine or tobacco in them. These include cigarettes, e-cigarettes, and chewing tobacco. If you need help quitting, ask your doctor.  Eat heart-healthy foods. Talk with your doctor about the right eating plan for you.  Exercise regularly as told by your doctor.  Do not drink alcohol.  Lose weight if you are overweight.  Do not use drugs, including cannabis. General instructions  If you have a condition that causes breathing to stop for a short period of time (apnea), treat it as told by your doctor.  Keep a healthy weight. Do not use diet pills unless your doctor says they are safe for you. Diet pills may make heart problems worse.  Keep all follow-up visits as told by your doctor. This is important. Contact a doctor if:  You notice a change   in the speed, rhythm, or strength of your heartbeat.  You are taking a blood-thinning medicine and you get more bruising.  You get tired more easily when you move or exercise.  You have a sudden change in weight. Get help right away if:   You have pain in your chest or your belly (abdomen).  You have trouble breathing.  You have side effects of blood thinners, such as blood in your vomit, poop (stool), or pee (urine), or bleeding that cannot  stop.  You have any signs of a stroke. "BE FAST" is an easy way to remember the main warning signs: ? B - Balance. Signs are dizziness, sudden trouble walking, or loss of balance. ? E - Eyes. Signs are trouble seeing or a change in how you see. ? F - Face. Signs are sudden weakness or loss of feeling in the face, or the face or eyelid drooping on one side. ? A - Arms. Signs are weakness or loss of feeling in an arm. This happens suddenly and usually on one side of the body. ? S - Speech. Signs are sudden trouble speaking, slurred speech, or trouble understanding what people say. ? T - Time. Time to call emergency services. Write down what time symptoms started.  You have other signs of a stroke, such as: ? A sudden, very bad headache with no known cause. ? Feeling like you may vomit (nausea). ? Vomiting. ? A seizure. These symptoms may be an emergency. Do not wait to see if the symptoms will go away. Get medical help right away. Call your local emergency services (911 in the [place] Do not drive yourself to the hospital. Summary  Atrial fibrillation is a type of heartbeat that is irregular or fast.  You are at higher risk of this condition if you smoke, are older, have diabetes, or are overweight.  Follow your doctor's instructions about medicines, diet, exercise, and follow-up visits.  Get help right away if you have signs or symptoms of a stroke.  Get help right away if you cannot catch your breath, or you have chest pain or discomfort. This information is not intended to replace advice given to you by your health care provider. Make sure you discuss any questions you have with your health care provider. Document Revised: 05/08/2019 Document Reviewed: 05/08/2019 Elsevier Patient Education  [place].   Atrial Fibrillation  Atrial fibrillation is a type of heartbeat that is irregular or fast. If you have this condition, your heart beats without any order. This makes it hard  for your heart to pump blood in a normal way. Atrial fibrillation may come and go, or it may become a long-lasting problem. If this condition is not treated, it can put you at higher risk for stroke, heart failure, and other heart problems. What are the causes? This condition may be caused by diseases that damage the heart. They include:  High blood pressure.  Heart failure.  Heart valve disease.  Heart surgery. Other causes include:  Diabetes.  Thyroid disease.  Being overweight.  Kidney disease. Sometimes the cause is not known. What increases the risk? You are more likely to develop this condition if:  You are older.  You smoke.  You exercise often and very hard.  You have a family history of this condition.  You are a man.  You use drugs.  You drink a lot of alcohol.  You have lung conditions, such as emphysema, pneumonia, or COPD.  You have  sleep apnea. What are the signs or symptoms? Common symptoms of this condition include:  A feeling that your heart is beating very fast.  Chest pain or discomfort.  Feeling short of breath.  Suddenly feeling light-headed or weak.  Getting tired easily during activity.  Fainting.  Sweating. In some cases, there are no symptoms. How is this treated? Treatment for this condition depends on underlying conditions and how you feel when you have atrial fibrillation. They include:  Medicines to: ? Prevent blood clots. ? Treat heart rate or heart rhythm problems.  Using devices, such as a pacemaker, to correct heart rhythm problems.  Doing surgery to remove the part of the heart that sends bad signals.  Closing an area where clots can form in the heart (left atrial appendage). In some cases, your doctor will treat other underlying conditions. Follow these instructions at home: Medicines  Take over-the-counter and prescription medicines only as told by your doctor.  Do not take any new medicines without first  talking to your doctor.  If you are taking blood thinners: ? Talk with your doctor before you take any medicines that have aspirin or NSAIDs, such as ibuprofen, in them. ? Take your medicine exactly as told by your doctor. Take it at the same time each day. ? Avoid activities that could hurt or bruise you. Follow instructions about how to prevent falls. ? Wear a bracelet that says you are taking blood thinners. Or, carry a card that lists what medicines you take. Lifestyle      Do not use any products that have nicotine or tobacco in them. These include cigarettes, e-cigarettes, and chewing tobacco. If you need help quitting, ask your doctor.  Eat heart-healthy foods. Talk with your doctor about the right eating plan for you.  Exercise regularly as told by your doctor.  Do not drink alcohol.  Lose weight if you are overweight.  Do not use drugs, including cannabis. General instructions  If you have a condition that causes breathing to stop for a short period of time (apnea), treat it as told by your doctor.  Keep a healthy weight. Do not use diet pills unless your doctor says they are safe for you. Diet pills may make heart problems worse.  Keep all follow-up visits as told by your doctor. This is important. Contact a doctor if:  You notice a change in the speed, rhythm, or strength of your heartbeat.  You are taking a blood-thinning medicine and you get more bruising.  You get tired more easily when you move or exercise.  You have a sudden change in weight. Get help right away if:   You have pain in your chest or your belly (abdomen).  You have trouble breathing.  You have side effects of blood thinners, such as blood in your vomit, poop (stool), or pee (urine), or bleeding that cannot stop.  You have any signs of a stroke. "BE FAST" is an easy way to remember the main warning signs: ? B - Balance. Signs are dizziness, sudden trouble walking, or loss of balance. ? E  - Eyes. Signs are trouble seeing or a change in how you see. ? F - Face. Signs are sudden weakness or loss of feeling in the face, or the face or eyelid drooping on one side. ? A - Arms. Signs are weakness or loss of feeling in an arm. This happens suddenly and usually on one side of the body. ? S - Speech. Signs are  sudden trouble speaking, slurred speech, or trouble understanding what people say. ? T - Time. Time to call emergency services. Write down what time symptoms started.  You have other signs of a stroke, such as: ? A sudden, very bad headache with no known cause. ? Feeling like you may vomit (nausea). ? Vomiting. ? A seizure. These symptoms may be an emergency. Do not wait to see if the symptoms will go away. Get medical help right away. Call your local emergency services (911 in the [place] Do not drive yourself to the hospital. Summary  Atrial fibrillation is a type of heartbeat that is irregular or fast.  You are at higher risk of this condition if you smoke, are older, have diabetes, or are overweight.  Follow your doctor's instructions about medicines, diet, exercise, and follow-up visits.  Get help right away if you have signs or symptoms of a stroke.  Get help right away if you cannot catch your breath, or you have chest pain or discomfort. This information is not intended to replace advice given to you by your health care provider. Make sure you discuss any questions you have with your health care provider. Document Revised: 05/08/2019 Document Reviewed: 05/08/2019 Elsevier Patient Education  2020 [employer].   Information on my medicine - ELIQUIS (apixaban)  This medication education was reviewed with me or my healthcare representative as part of my discharge preparation.  The pharmacist that spoke with me during my hospital stay was:  Gardner Candle, [facility]  Why was Eliquis prescribed for you? Eliquis was prescribed for you to reduce the risk of a blood clot  forming that can cause a stroke if you have a medical condition called atrial fibrillation (a type of irregular heartbeat).  What do You need to know about Eliquis ? Take your Eliquis TWICE DAILY - one tablet in the morning and one tablet in the evening with or without food. If you have difficulty swallowing the tablet whole please discuss with your pharmacist how to take the medication safely.  Take Eliquis exactly as prescribed by your doctor and DO NOT stop taking Eliquis without talking to the doctor who prescribed the medication.  Stopping may increase your risk of developing a stroke.  Refill your prescription before you run out.  After discharge, you should have regular check-up appointments with your healthcare provider that is prescribing your Eliquis.  In the future your dose may need to be changed if your kidney function or weight changes by a significant amount or as you get older.  What do you do if you miss a dose? If you miss a dose, take it as soon as you remember on the same day and resume taking twice daily.  Do not take more than one dose of ELIQUIS at the same time to make up a missed dose.  Important Safety Information A possible side effect of Eliquis is bleeding. You should call your healthcare provider right away if you experience any of the following: ? Bleeding from an injury or your nose that does not stop. ? Unusual colored urine (red or dark brown) or unusual colored stools (red or black). ? Unusual bruising for unknown reasons. ? A serious fall or if you hit your head (even if there is no bleeding).  Some medicines may interact with Eliquis and might increase your risk of bleeding or clotting while on Eliquis. To help avoid this, consult your healthcare provider or pharmacist prior to using any new prescription or  non-prescription medications, including herbals, vitamins, non-steroidal anti-inflammatory drugs (NSAIDs) and supplements.  This website has more  information on Eliquis (apixaban): http://www.eliquis.com/eliquis/home

## 2020-11-29 NOTE — Progress Notes (Signed)
Pt came to unit on stretcher , accompanied by nurse, no complaints, made comfortable in bed, hs ivf amaidarone and heparin infusing.

## 2020-11-29 NOTE — ED Notes (Signed)
Attempted to call report, RN will call back.

## 2020-11-29 NOTE — Progress Notes (Signed)
  Echocardiogram 2D Echocardiogram has been performed.  Delcie Roch 11/29/2020, 9:34 AM

## 2020-11-29 NOTE — Progress Notes (Signed)
ANTICOAGULATION CONSULT NOTE  Pharmacy Consult for Heparin Indication: chest pain/ACS, afib  Allergies  Allergen Reactions  . Penicillins Other (See Comments)    From childhood- reaction not recalled    Patient Measurements: Height: 5\' 7"  (170.2 cm) Weight: 119.7 kg (263 lb 14.3 oz) (scale A) IBW/kg (Calculated) : 66.1 Heparin Dosing Weight: 95 kg  Vital Signs: Temp: 98 F (36.7 C) (01/02 0348) Temp Source: Oral (01/02 0348) BP: 115/58 (01/02 0348) Pulse Rate: 77 (01/02 0348)  Labs: Recent Labs    11/28/20 1843 11/28/20 1932 11/28/20 2106 11/29/20 0000 11/29/20 0232 11/29/20 0452  HGB 15.1  --   --   --   --  14.7  HCT 43.4  --   --   --   --  41.7  PLT 286  --   --   --   --  284  HEPARINUNFRC  --   --   --   --   --  0.10*  CREATININE 1.11  --   --   --  0.92  --   TROPONINIHS  --    < > 46* 42* 32*  --    < > = values in this interval not displayed.    Estimated Creatinine Clearance: 113.6 mL/min (by C-G formula based on SCr of 0.92 mg/dL).   Medical History: Past Medical History:  Diagnosis Date  . Hypertension   . Obesity     Medications:  Scheduled:  . aspirin  324 mg Oral NOW   Or  . aspirin  300 mg Rectal NOW  . aspirin EC  81 mg Oral Daily  . atorvastatin  80 mg Oral Daily  . heparin  2,000 Units Intravenous Once  . insulin aspart  0-9 Units Subcutaneous TID WC  . losartan  25 mg Oral Daily  . metoprolol tartrate  12.5 mg Oral BID    Assessment: Patient is a 89 yom that presents to the ED with CP. Patient was found to be in afib as well has having an elevated trop. Pharmacy has been asked to dose heparin at this time for  Afib.   1/2 AM update:  Heparin level low No issues per RN  Goal of Therapy:  Heparin level 0.3-0.7 units/ml Monitor platelets by anticoagulation protocol: Yes   Plan:  -Heparin 2000 units re-bolus -Inc heparin to 1600 units/hr -1400 heparin level -Monitor patient for s/s of bleeding and CBC while on heparin    57, PharmD, BCPS Clinical Pharmacist Phone: [phone]

## 2020-11-30 ENCOUNTER — Telehealth: Payer: Self-pay | Admitting: Family Medicine

## 2020-11-30 NOTE — Telephone Encounter (Signed)
Reviewing status of sleep study. 02/07/2019 pt NO SHOWED 04/16/2019 sleep study at [facility] on 05/26/20 and again on 09/25/20 with [facility], I cannot determine activity.  Called [facility] [phone] lmtrc.

## 2020-11-30 NOTE — Telephone Encounter (Signed)
Sleep study ordered 02/07/2019, pt no showed 04/16/2019  Re0rdered 05/26/20 and 09/25/20  I do not see activity.  Called [facility] [phone] lmtrc.

## 2020-12-01 ENCOUNTER — Telehealth: Payer: Self-pay | Admitting: Family Medicine

## 2020-12-01 DIAGNOSIS — E669 Obesity, unspecified: Secondary | ICD-10-CM

## 2020-12-01 NOTE — Telephone Encounter (Signed)
Spoke with Kya at [facility] she states both of these orders are marked completed.  Please put in another order for Sleep Study and mark as FUTURE.  Please let me know when done and I will follow up with Kya.

## 2020-12-01 NOTE — Telephone Encounter (Signed)
Called Sleep center again, lmtrc.

## 2020-12-02 ENCOUNTER — Telehealth: Payer: Self-pay

## 2020-12-02 DIAGNOSIS — G4719 Other hypersomnia: Secondary | ICD-10-CM

## 2020-12-02 NOTE — Telephone Encounter (Signed)
Left message for [facility]

## 2020-12-03 NOTE — Telephone Encounter (Signed)
Called Stanley Wilkerson at [facility] she sees the new order and will check for prior auth if needed, if not will call pt today.

## 2020-12-07 NOTE — Telephone Encounter (Signed)
Kya with [place] [facility] called and states the patients' insurance will not cover in office sleep study.  They will only cover AT HOME sleep study.  Please put in order for AT home sleep study with [facility] and they will handle.

## 2020-12-07 NOTE — Telephone Encounter (Signed)
Done

## 2020-12-09 NOTE — Telephone Encounter (Signed)
Looks like another home sleep study was ordered. I think he is supposed to a in lab due to the need for four hours. Dr. Annamaria Boots looks like the ordering provider. Please advise If I need to keep watching for this [facility]

## 2020-12-09 NOTE — Telephone Encounter (Signed)
Advised patient of the same info.

## 2020-12-10 NOTE — Telephone Encounter (Signed)
Patient insurance will only pay for AT [place].  Pt is aware.

## 2020-12-30 ENCOUNTER — Encounter (HOSPITAL_BASED_OUTPATIENT_CLINIC_OR_DEPARTMENT_OTHER): Payer: Self-pay

## 2020-12-30 ENCOUNTER — Ambulatory Visit (HOSPITAL_BASED_OUTPATIENT_CLINIC_OR_DEPARTMENT_OTHER): Payer: 59

## 2020-12-30 ENCOUNTER — Other Ambulatory Visit: Payer: Self-pay

## 2021-01-13 ENCOUNTER — Other Ambulatory Visit: Payer: Self-pay

## 2021-01-13 ENCOUNTER — Ambulatory Visit (HOSPITAL_BASED_OUTPATIENT_CLINIC_OR_DEPARTMENT_OTHER): Payer: 59 | Attending: Family Medicine | Admitting: Internal Medicine

## 2021-01-26 ENCOUNTER — Other Ambulatory Visit: Payer: Self-pay

## 2021-01-26 ENCOUNTER — Encounter: Payer: Self-pay | Admitting: Family Medicine

## 2021-01-26 ENCOUNTER — Ambulatory Visit: Payer: 59 | Admitting: Family Medicine

## 2021-01-26 VITALS — BP 162/98 | HR 66 | Temp 98.2°F | Wt 274.8 lb

## 2021-01-26 DIAGNOSIS — G4719 Other hypersomnia: Secondary | ICD-10-CM

## 2021-01-26 DIAGNOSIS — I152 Hypertension secondary to endocrine disorders: Secondary | ICD-10-CM

## 2021-01-26 DIAGNOSIS — E785 Hyperlipidemia, unspecified: Secondary | ICD-10-CM

## 2021-01-26 DIAGNOSIS — Z8616 Personal history of COVID-19: Secondary | ICD-10-CM

## 2021-01-26 DIAGNOSIS — E1159 Type 2 diabetes mellitus with other circulatory complications: Secondary | ICD-10-CM

## 2021-01-26 DIAGNOSIS — E1169 Type 2 diabetes mellitus with other specified complication: Secondary | ICD-10-CM | POA: Diagnosis not present

## 2021-01-26 DIAGNOSIS — Z9841 Cataract extraction status, right eye: Secondary | ICD-10-CM

## 2021-01-26 DIAGNOSIS — H409 Unspecified glaucoma: Secondary | ICD-10-CM

## 2021-01-26 LAB — POCT GLYCOSYLATED HEMOGLOBIN (HGB A1C): Hemoglobin A1C: 6.9 % — AB (ref 4.0–5.6)

## 2021-01-26 MED ORDER — LOSARTAN POTASSIUM-HCTZ 50-12.5 MG PO TABS: 1.0000 | ORAL_TABLET | Freq: Every day | ORAL | 3 refills | Status: DC

## 2021-01-26 NOTE — Patient Instructions (Signed)
Stop the losartan and start the new medicine

## 2021-01-26 NOTE — Progress Notes (Signed)
  Subjective:    Patient ID: Stanley Wilkerson, male    DOB: 14-Oct-1966, 55 y.o.   MRN: [id number]  Stanley Wilkerson is a 55 y.o. male who presents for follow-up of Type 2 diabetes mellitus.  Home blood sugar records: pt not checking Current symptoms/problems include none at this time. Daily foot checks:yes   Any foot concerns: left heel pain Exercise: staying active  Diet: poor He continues on atorvastatin and having no difficulty with that.  He is also taking losartan and metoprolol.  He is now taking Eliquis due to recent diagnosis of atrial fib.  He is also on amiodarone for control of his heart rate.  He is scheduled to follow-up with cardiology concerning that.  He apparently had difficulty getting a home sleep study done and will therefore need to be referred for a in-house study.  He also plans to follow-up with ophthalmology concerning continued difficulty with visual problems due to cataract extraction. The following portions of the patient's history were reviewed and updated as appropriate: allergies, current medications, past medical history, past social history and problem list.  ROS as in subjective above.     Objective:    Physical Exam Alert and in no distress otherwise not examined.  Blood pressure (!) 162/98, pulse 66, temperature 98.2 F (36.8 C), weight 274 lb 12.8 oz (124.6 kg), SpO2 96 %.  Lab Review Diabetic Labs Latest Ref Rng & Units 11/29/2020 11/28/2020 09/25/2020 05/06/2020 02/07/2019  HbA1c 4.8 - 5.6 % 6.2(H) - 6.8(A) 6.9(H) -  Chol 0 - 200 mg/dL 194 - - 243(H) 197  HDL >40 mg/dL 41 - - 53 50  Calc LDL 0 - 99 mg/dL 133(H) - - 168(H) 134(H)  Triglycerides <150 mg/dL 102 - - 121 63  Creatinine 0.61 - 1.24 mg/dL 0.92 1.11 - 0.91 1.04   BP/Weight 01/26/2021 01/13/2021 12/30/2020 11/29/2020 09/32/3557  Systolic BP 322 - - 025 427  Diastolic BP 98 - - 75 94  Wt. (Lbs) 274.8 273 273 263.89 269  BMI 43.04 42.76 42.76 41.33 42.13   Hemoglobin A1c is 6.9 [place]   reports that he has quit smoking. He has never used smokeless tobacco. He reports that he does not drink alcohol and does not use drugs.     Assessment & Plan:    Type 2 diabetes mellitus with other specified complication, without long-term current use of insulin (HCC) - Plan: POCT glycosylated hemoglobin (Hb A1C)  History of COVID-19  Hyperlipidemia associated with type 2 diabetes mellitus ([place])  Hypertension associated with diabetes ([place]) - Plan: losartan-hydrochlorothiazide (HYZAAR) 50-12.5 MG tablet  Morbid obesity (HCC)  Excessive daytime sleepiness - Plan: [facility]  History of right cataract extraction  Glaucoma, unspecified glaucoma type, unspecified laterality   1. Rx changes: Switch to Hyzaar for better control of his blood pressure 2. Education: Reviewed 'ABCs' of diabetes management (respective goals in parentheses):  A1C (<7), blood pressure (<130/80), and cholesterol (LDL <100). 3. Compliance at present is estimated to be poor. Efforts to improve compliance (if necessary) will be directed at increased exercise.  He states that he is doing as months as much as he can for his weight but there has been very little change in his eating habits.  Discussed sending him to nutritionist but not interested due to cost.  Explained that he will eventually need to be placed on diabetes medications. 4. Follow up: 4 months

## 2021-02-07 ENCOUNTER — Other Ambulatory Visit: Payer: Self-pay

## 2021-02-07 ENCOUNTER — Emergency Department (HOSPITAL_COMMUNITY)
Admission: EM | Admit: 2021-02-07 | Discharge: 2021-02-07 | Disposition: A | Discharge status: Home / Self Care | Payer: 59 | Attending: Emergency Medicine | Admitting: Emergency Medicine

## 2021-02-07 ENCOUNTER — Emergency Department (HOSPITAL_COMMUNITY): Payer: 59

## 2021-02-07 DIAGNOSIS — Z8616 Personal history of COVID-19: Secondary | ICD-10-CM | POA: Diagnosis not present

## 2021-02-07 DIAGNOSIS — Z87891 Personal history of nicotine dependence: Secondary | ICD-10-CM | POA: Insufficient documentation

## 2021-02-07 DIAGNOSIS — E876 Hypokalemia: Secondary | ICD-10-CM | POA: Diagnosis not present

## 2021-02-07 DIAGNOSIS — Z794 Long term (current) use of insulin: Secondary | ICD-10-CM | POA: Diagnosis not present

## 2021-02-07 DIAGNOSIS — Z79899 Other long term (current) drug therapy: Secondary | ICD-10-CM | POA: Insufficient documentation

## 2021-02-07 DIAGNOSIS — R42 Dizziness and giddiness: Secondary | ICD-10-CM | POA: Insufficient documentation

## 2021-02-07 DIAGNOSIS — E119 Type 2 diabetes mellitus without complications: Secondary | ICD-10-CM | POA: Insufficient documentation

## 2021-02-07 DIAGNOSIS — I10 Essential (primary) hypertension: Secondary | ICD-10-CM | POA: Insufficient documentation

## 2021-02-07 DIAGNOSIS — R1110 Vomiting, unspecified: Secondary | ICD-10-CM | POA: Insufficient documentation

## 2021-02-07 DIAGNOSIS — I4891 Unspecified atrial fibrillation: Secondary | ICD-10-CM | POA: Insufficient documentation

## 2021-02-07 DIAGNOSIS — Z7901 Long term (current) use of anticoagulants: Secondary | ICD-10-CM | POA: Diagnosis not present

## 2021-02-07 LAB — CBC WITH DIFFERENTIAL/PLATELET
Abs Immature Granulocytes: 0.04 10*3/uL (ref 0.00–0.07)
Basophils Absolute: 0.1 10*3/uL (ref 0.0–0.1)
Basophils Relative: 1 %
Eosinophils Absolute: 0.5 10*3/uL (ref 0.0–0.5)
Eosinophils Relative: 6 %
HCT: 44.1 % (ref 39.0–52.0)
Hemoglobin: 14.9 g/dL (ref 13.0–17.0)
Immature Granulocytes: 0 %
Lymphocytes Relative: 33 %
Lymphs Abs: 3.2 10*3/uL (ref 0.7–4.0)
MCH: 29.7 pg (ref 26.0–34.0)
MCHC: 33.8 g/dL (ref 30.0–36.0)
MCV: 87.8 fL (ref 80.0–100.0)
Monocytes Absolute: 0.9 10*3/uL (ref 0.1–1.0)
Monocytes Relative: 10 %
Neutro Abs: 4.9 10*3/uL (ref 1.7–7.7)
Neutrophils Relative %: 50 %
Platelets: 373 10*3/uL (ref 150–400)
RBC: 5.02 MIL/uL (ref 4.22–5.81)
RDW: 13.8 % (ref 11.5–15.5)
WBC: 9.6 10*3/uL (ref 4.0–10.5)
nRBC: 0 % (ref 0.0–0.2)

## 2021-02-07 LAB — BASIC METABOLIC PANEL
Anion gap: 12 (ref 5–15)
BUN: 17 mg/dL (ref 6–20)
CO2: 26 mmol/L (ref 22–32)
Calcium: 8.9 mg/dL (ref 8.9–10.3)
Chloride: 98 mmol/L (ref 98–111)
Creatinine, Ser: 1.08 mg/dL (ref 0.61–1.24)
GFR, Estimated: >60 mL/min (ref >60)
Glucose, Bld: 211 mg/dL — ABNORMAL HIGH (ref 70–99)
Potassium: 2.5 mmol/L — CL (ref 3.5–5.1)
Sodium: 136 mmol/L (ref 135–145)

## 2021-02-07 MED ORDER — PROMETHAZINE HCL 25 MG/ML IJ SOLN
25.0000 mg | Freq: Once | INTRAMUSCULAR | Status: AC
  Administered 2021-02-07: 25 mg via INTRAVENOUS
  Filled 2021-02-07: qty 1

## 2021-02-07 MED ORDER — ONDANSETRON 8 MG PO TBDP
8.0000 mg | ORAL_TABLET | Freq: Three times a day (TID) | ORAL | 0 refills | Status: DC | PRN
Start: 2021-02-07 — End: 2023-08-11

## 2021-02-07 MED ORDER — MECLIZINE HCL 25 MG PO TABS: 25.0000 mg | ORAL_TABLET | Freq: Three times a day (TID) | ORAL | 0 refills | Status: AC | PRN

## 2021-02-07 MED ORDER — POTASSIUM CHLORIDE 10 MEQ/100ML IV SOLN
10.0000 meq | INTRAVENOUS | Status: AC
  Administered 2021-02-07 (×2): 10 meq via INTRAVENOUS
  Filled 2021-02-07 (×2): qty 100

## 2021-02-07 MED ORDER — POTASSIUM CHLORIDE CRYS ER 20 MEQ PO TBCR: 20.0000 meq | EXTENDED_RELEASE_TABLET | Freq: Two times a day (BID) | ORAL | 0 refills | Status: DC

## 2021-02-07 MED ORDER — LORAZEPAM 2 MG/ML IJ SOLN: 1.0000 mg | Freq: Once | INTRAMUSCULAR | Status: DC | PRN

## 2021-02-07 MED ORDER — POTASSIUM CHLORIDE CRYS ER 20 MEQ PO TBCR
40.0000 meq | EXTENDED_RELEASE_TABLET | Freq: Once | ORAL | Status: AC
  Administered 2021-02-07: 40 meq via ORAL
  Filled 2021-02-07: qty 2

## 2021-02-07 MED ORDER — MECLIZINE HCL 25 MG PO TABS
50.0000 mg | ORAL_TABLET | Freq: Once | ORAL | Status: AC
  Administered 2021-02-07: 50 mg via ORAL
  Filled 2021-02-07: qty 2

## 2021-02-07 MED ORDER — SODIUM CHLORIDE 0.9 % IV BOLUS
1000.0000 mL | Freq: Once | INTRAVENOUS | Status: AC
  Administered 2021-02-07: 1000 mL via INTRAVENOUS

## 2021-02-07 NOTE — ED Provider Notes (Signed)
Patient presented with complaints of dizziness.  Initially seen by Dr. Dina Rich.  Please see her note.  Symptoms felt most likely to be related to peripheral vertigo.  Patient was treated symptomatically.  He also is getting treatment for hypokalemia.  8:33 AM patient is still having persistent severe symptoms.  He feels like his head is heavy.  Not able to ambulate. Patient does have risk factors for stroke.  Considering his lack of improvement and concern for possible central cause we will proceed with MRI.  11:00 AM MRI was negative for acute stroke.  Sx have improved.  Pt able to walk.  Feels ready for discharge at this time.   Dorie Rank, MD 02/07/21 1100

## 2021-02-07 NOTE — ED Triage Notes (Signed)
Pt said hx of vertigo 7 years ago and tonight about 5 hrs ago  pt said he felt very dizzy and felt as if he had to hold onto the wall to walk. The room spins and makes him very sick at his stomach. Pt has great grips and all equal. pt has no headaches

## 2021-02-07 NOTE — ED Notes (Signed)
Patient transported to MRI 

## 2021-02-07 NOTE — ED Notes (Signed)
Patient transported back from MRI. 

## 2021-02-07 NOTE — ED Notes (Signed)
Patient verbalizes understanding of discharge instructions. Opportunity for questioning and answers were provided. Armband removed by staff, pt discharged from ED.  

## 2021-02-07 NOTE — ED Notes (Signed)
John Collymore, brother, would like call when patient gets to room. [phone]

## 2021-02-07 NOTE — Discharge Instructions (Addendum)
Follow-up with your primary care doctor to have your potassium level rechecked.  Take the medications as prescribed.

## 2021-02-07 NOTE — ED Provider Notes (Signed)
[facility] EMERGENCY DEPARTMENT Provider Note   CSN: [id number] Arrival date & time: 02/07/21  4496     History Chief Complaint  Patient presents with  . Dizziness  . Emesis    Stanley Wilkerson is a 55 y.o. male.  HPI     This a 55 year old male with a history of hypertension and obesity who presents with dizziness.  Patient reports acute onset of dizziness around 1 AM.  He describes room spinning dizziness that is worse with head movement and eye opening.  He states that he feels "drunk."  He has a history of limited vertigo several years ago but "it was not this bad."  He describes multiple episodes of nonbilious, nonbloody emesis.  He is not taking anything for symptoms.  Denies any recent illnesses, fevers, cough, shortness of breath, ear discomfort.  He denies weakness, numbness, strokelike symptoms.  Past Medical History:  Diagnosis Date  . Hypertension   . Obesity     Patient Active Problem List   Diagnosis Date Noted  . Morbid obesity ([place]) 01/26/2021  . History of COVID-19 01/26/2021  . Glaucoma 01/26/2021  . History of right cataract extraction 01/26/2021  . New onset atrial fibrillation ([place]) 11/28/2020  . Diabetes mellitus ([place]) 05/26/2020  . Family history of heart disease in male family member before age 61 12/01/2014  . Hyperlipidemia LDL goal <70 12/01/2014  . LVH (left ventricular hypertrophy) 08/26/2014  . Obesity (BMI 30-39.9) 08/26/2014  . Hypertension associated with diabetes ([place]) 08/26/2014    Past Surgical History:  Procedure Laterality Date  . CATARACT EXTRACTION Right 07/23/2020       Family History  Problem Relation Age of Onset  . Heart disease Father     Social History   Tobacco Use  . Smoking status: Former Research scientist (life sciences)  . Smokeless tobacco: Never Used  Substance Use Topics  . Alcohol use: No  . Drug use: No    Home Medications Prior to Admission medications   Medication Sig Start Date End Date Taking?  Authorizing Provider  Accu-Chek Softclix Lancets lancets 1 each by Other route 2 (two) times daily at 8 am and 10 pm. Use as instructed 05/26/20   Denita Lung, MD  amiodarone (PACERONE) 200 MG tablet Take 1 tablet (200 mg total) by mouth daily. 11/29/20   Charolette Forward, MD  apixaban (ELIQUIS) 5 MG TABS tablet Take 1 tablet (5 mg total) by mouth 2 (two) times daily. 11/29/20   Charolette Forward, MD  atorvastatin (LIPITOR) 80 MG tablet Take 1 tablet (80 mg total) by mouth daily. 11/30/20   Charolette Forward, MD  dorzolamide-timolol (COSOPT) 22.3-6.8 MG/ML ophthalmic solution Place 1 drop into both eyes 2 (two) times daily.    [provider]  glucose blood test strip 1 each by Other route as needed for other. Use as instructed to check blood sugar daily 05/26/20   Denita Lung, MD  losartan-hydrochlorothiazide [facility]) 50-12.5 MG tablet Take 1 tablet by mouth daily. 01/26/21   Denita Lung, MD  metoprolol succinate (TOPROL XL) 25 MG 24 hr tablet Take 1 tablet (25 mg total) by mouth daily. 11/29/20 11/29/21  Charolette Forward, MD  nitroGLYCERIN (NITROSTAT) 0.4 MG SL tablet Place 1 tablet (0.4 mg total) under the tongue every 5 (five) minutes x 3 doses as needed for chest pain. 11/29/20   Charolette Forward, MD    Allergies    Penicillins  Review of Systems   Review of Systems  Constitutional: Negative  for fever.  Respiratory: Negative for shortness of breath.   Cardiovascular: Negative for chest pain.  Gastrointestinal: Positive for nausea and vomiting. Negative for abdominal pain.  Genitourinary: Negative for dysuria.  Neurological: Positive for dizziness.  All other systems reviewed and are negative.   Physical Exam Updated Vital Signs BP (!) 182/77   Pulse 68   Temp 97.9 F (36.6 C) (Oral)   Resp 15   SpO2 93%   Physical Exam Vitals and nursing note reviewed.  Constitutional:      Appearance: He is well-developed. He is obese.     Comments: Uncomfortable appearing but nontoxic  HENT:      Head: Normocephalic and atraumatic.     Nose: Nose normal.     Mouth/Throat:     Mouth: Mucous membranes are moist.  Eyes:     Pupils: Pupils are equal, round, and reactive to light.     Comments: Horizontal nystagmus noted  Cardiovascular:     Rate and Rhythm: Normal rate and regular rhythm.     Heart sounds: Normal heart sounds. No murmur heard.   Pulmonary:     Effort: Pulmonary effort is normal. No respiratory distress.     Breath sounds: Normal breath sounds. No wheezing.  Abdominal:     General: Bowel sounds are normal.     Palpations: Abdomen is soft.     Tenderness: There is no abdominal tenderness. There is no rebound.  Musculoskeletal:     Cervical back: Neck supple.     Right lower leg: No edema.     Left lower leg: No edema.  Lymphadenopathy:     Cervical: No cervical adenopathy.  Skin:    General: Skin is warm and dry.  Neurological:     Mental Status: He is alert and oriented to person, place, and time.     Comments: Fluent speech, cranial nerves II through 12 intact, no dysmetria to finger-nose-finger  Psychiatric:        Mood and Affect: Mood normal.     ED Results / Procedures / Treatments   Labs (all labs ordered are listed, but only abnormal results are displayed) Labs Reviewed  BASIC METABOLIC PANEL - Abnormal; Notable for the following components:      Result Value   Potassium 2.5 (*)    Glucose, Bld 211 (*)    All other components within normal limits  CBC WITH DIFFERENTIAL/PLATELET    EKG EKG Interpretation  Date/Time:  Sunday February 07 2021 03:43:53 EDT Ventricular Rate:  76 PR Interval:  170 QRS Duration: 110 QT Interval:  548 QTC Calculation: 616 R Axis:   61 Text Interpretation: Normal sinus rhythm Left ventricular hypertrophy with repolarization abnormality ( Cornell product ) Prolonged QT Abnormal ECG Similar to prior Confirmed by Thayer Jew [phone]) on 02/07/2021 4:58:25 AM   Radiology No results  found.  Procedures .Critical Care Performed by: Merryl Hacker, MD Authorized by: Merryl Hacker, MD   Critical care provider statement:    Critical care time (minutes):  31   Critical care was time spent personally by me on the following activities:  Discussions with consultants, evaluation of patient's response to treatment, examination of patient, ordering and performing treatments and interventions, ordering and review of laboratory studies, ordering and review of radiographic studies, pulse oximetry, re-evaluation of patient's condition, obtaining history from patient or surrogate and review of old charts     Medications Ordered in ED Medications  potassium chloride 10 mEq in 100 mL  IVPB (10 mEq Intravenous New Bag/Given 02/07/21 0626)  potassium chloride SA (KLOR-CON) CR tablet 40 mEq (40 mEq Oral Given 02/07/21 0502)  meclizine (ANTIVERT) tablet 50 mg (50 mg Oral Given 02/07/21 0542)  promethazine (PHENERGAN) injection 25 mg (25 mg Intravenous Given 02/07/21 0542)  sodium chloride 0.9 % bolus 1,000 mL (1,000 mLs Intravenous New Bag/Given 02/07/21 0546)    ED Course  I have reviewed the triage vital signs and the nursing notes.  Pertinent labs & imaging results that were available during my care of the patient were reviewed by me and considered in my medical decision making (see chart for details).    MDM Rules/Calculators/A&P                          Patient presents with dizziness.  He is ill-appearing but nontoxic.  Blood pressure notable for BP of 182/77.  He appears acutely vertiginous.  His neuro exam is largely reassuring.  Suspect peripheral vertigo although central vertigo would also be consideration.  He is otherwise neuro intact.  Patient treated with meclizine and Phenergan.  Notably his labs show potassium of 2.5.  He was given oral and 2 IV rounds of potassium.  7:06 AM On recheck, patient reports improvement of his dizziness although he feels "loopy."  This  may be related to medication effect.  Potassium still infusing.  We will continue to monitor. Final Clinical Impression(s) / ED Diagnoses Final diagnoses:  Vertigo  Hypokalemia    Rx / DC Orders ED Discharge Orders    None       Merryl Hacker, MD 02/07/21 [phone]

## 2021-02-19 ENCOUNTER — Other Ambulatory Visit: Payer: Self-pay

## 2021-02-19 DIAGNOSIS — G4719 Other hypersomnia: Secondary | ICD-10-CM

## 2021-04-02 ENCOUNTER — Telehealth: Payer: Self-pay

## 2021-04-02 NOTE — Telephone Encounter (Signed)
Pt states he still hasn't heard anything back on his in office sleep study that was supposed to be done at [facility]. Can you please check on this   Thanks

## 2021-04-05 NOTE — Telephone Encounter (Signed)
LVM at the sleep center for them to call the office back and advise.

## 2021-04-06 NOTE — Telephone Encounter (Signed)
Still waiting on his insurance .

## 2021-04-20 ENCOUNTER — Telehealth: Payer: Self-pay

## 2021-04-20 ENCOUNTER — Other Ambulatory Visit: Payer: Self-pay

## 2021-04-20 DIAGNOSIS — G4719 Other hypersomnia: Secondary | ICD-10-CM

## 2021-04-20 NOTE — Telephone Encounter (Signed)
Pt will need a peer to peer for his split night sleep study. Will get the info and set up when you are ready please advise [facility]

## 2021-05-10 ENCOUNTER — Encounter: Payer: Self-pay | Admitting: Family Medicine

## 2021-05-10 ENCOUNTER — Ambulatory Visit: Payer: 59 | Admitting: Family Medicine

## 2021-05-10 VITALS — BP 154/90 | HR 65 | Temp 98.3°F | Ht 67.0 in | Wt 273.4 lb

## 2021-05-10 DIAGNOSIS — Z8249 Family history of ischemic heart disease and other diseases of the circulatory system: Secondary | ICD-10-CM

## 2021-05-10 DIAGNOSIS — Z9849 Cataract extraction status, unspecified eye: Secondary | ICD-10-CM

## 2021-05-10 DIAGNOSIS — E669 Obesity, unspecified: Secondary | ICD-10-CM

## 2021-05-10 DIAGNOSIS — Z0000 Encounter for general adult medical examination without abnormal findings: Secondary | ICD-10-CM

## 2021-05-10 DIAGNOSIS — E785 Hyperlipidemia, unspecified: Secondary | ICD-10-CM

## 2021-05-10 DIAGNOSIS — I152 Hypertension secondary to endocrine disorders: Secondary | ICD-10-CM

## 2021-05-10 DIAGNOSIS — Z1159 Encounter for screening for other viral diseases: Secondary | ICD-10-CM

## 2021-05-10 DIAGNOSIS — E1159 Type 2 diabetes mellitus with other circulatory complications: Secondary | ICD-10-CM

## 2021-05-10 DIAGNOSIS — Z8616 Personal history of COVID-19: Secondary | ICD-10-CM | POA: Diagnosis not present

## 2021-05-10 DIAGNOSIS — Z2821 Immunization not carried out because of patient refusal: Secondary | ICD-10-CM

## 2021-05-10 DIAGNOSIS — I4891 Unspecified atrial fibrillation: Secondary | ICD-10-CM

## 2021-05-10 LAB — POCT GLYCOSYLATED HEMOGLOBIN (HGB A1C): Hemoglobin A1C: 6.6 % — AB (ref 4.0–5.6)

## 2021-05-10 MED ORDER — LOSARTAN POTASSIUM-HCTZ 100-12.5 MG PO TABS: 1.0000 | ORAL_TABLET | Freq: Every day | ORAL | 3 refills | Status: DC

## 2021-05-10 NOTE — Progress Notes (Signed)
   Subjective:    Patient ID: Stanley Wilkerson, male    DOB: July 27, 1966, 55 y.o.   MRN: [id number]  HPI He is here for complete examination.  He does have underlying diabetes and states that he is trying to make a change in his eating habits.  He does state that he keeps himself quite physically active with his work.  Presently he is taking Lipitor for his cholesterol as well as losartan/HCTZ and metoprolol for his blood pressure.  He does see cardiology and is presently on amiodarone and Eliquis for treatment of his atrial fib.  He does follow-up with them regularly.  He does have a previous history of COVID.  He has not had vaccine and is not interested in getting them.  He does give a vague history of wheezing but states that it goes away when he stands up and moves around.  He has had cataract surgery.   Review of Systems  All other systems reviewed and are negative.     Objective:   Physical Exam Alert and in no distress. Tympanic membranes and canals are normal. Pharyngeal area is normal. Neck is supple without adenopathy or thyromegaly. Cardiac exam shows a regular  rhythm without murmurs or gallops. Lungs are clear to auscultation. Hemoglobin A1c is 6.6       Assessment & Plan:  Obesity (BMI 30-39.9)  Hypertension associated with diabetes (Stanley Wilkerson)  Family history of heart disease in male family member before age 67  Hyperlipidemia LDL goal <70 - Plan: Lipid panel  History of COVID-19  Morbid obesity (Stanley Wilkerson)  Atrial fibrillation, unspecified type (Stanley Wilkerson)  Type 2 diabetes mellitus with other circulatory complication, without long-term current use of insulin (Stanley Wilkerson) - Plan: POCT glycosylated hemoglobin (Hb A1C)  Routine general medical examination at a health care facility  Immunization refused  Need for hepatitis C screening test - Plan: Hepatitis C antibody  Cataract extraction status of eye, unspecified laterality Discussed the COVID-vaccine again with him and he is  still not interested.  Encouraged him to continue with his physical activity and make further changes in his diet especially in regard to carbohydrates.  At this point he seems to be doing fairly well.  I will increase his Hyzaar.

## 2021-05-11 LAB — LIPID PANEL
Chol/HDL Ratio: 3.6 ratio (ref 0.0–5.0)
Cholesterol, Total: 202 mg/dL — ABNORMAL HIGH (ref 100–199)
HDL: 56 mg/dL (ref >39)
LDL Chol Calc (NIH): 129 mg/dL — ABNORMAL HIGH (ref 0–99)
Triglycerides: 92 mg/dL (ref 0–149)
VLDL Cholesterol Cal: 17 mg/dL (ref 5–40)

## 2021-05-11 LAB — HEPATITIS C ANTIBODY: Hep C Virus Ab: 0.2 s/co ratio (ref 0.0–0.9)

## 2021-05-27 ENCOUNTER — Telehealth: Payer: Self-pay | Admitting: Internal Medicine

## 2021-05-27 ENCOUNTER — Encounter: Payer: Self-pay | Admitting: Family Medicine

## 2021-05-27 DIAGNOSIS — G4719 Other hypersomnia: Secondary | ICD-10-CM

## 2021-05-27 DIAGNOSIS — E669 Obesity, unspecified: Secondary | ICD-10-CM

## 2021-05-27 DIAGNOSIS — G479 Sleep disorder, unspecified: Secondary | ICD-10-CM

## 2021-05-27 NOTE — Telephone Encounter (Signed)
I have submitted a letter to the appeals department for pt to try to approve an in lab study. Either Split Night CPT 95811 or NPSG (nocturnal polysomnography) which is a baseline sleep study CPT- 95810. I have faxed this to [phone] and fax number [phone].I have submitted medical records as well for this.   Reference # [id number] [facility] Provider #- [phone] [employer] [place]- [phone]

## 2021-06-02 ENCOUNTER — Ambulatory Visit: Payer: 59 | Admitting: Family Medicine

## 2021-06-04 NOTE — Telephone Encounter (Signed)
Called to check the status of appeal. It was submitted on 6/30 and still in process. It can take up to 7/15 to get appeal results. I have asked that this be expedited so it can take 3-4 business day so I will call back next week for status update  Case # [id number] Reference # [id number]

## 2021-06-10 NOTE — Telephone Encounter (Signed)
Called and checked the status of this prior auth for sleep study and was told it will be done by 7/15 and to check back tomorrow on this

## 2021-06-11 NOTE — Telephone Encounter (Signed)
Appeal for in lab sleep study has been denied. Please advise next step? Should we just refer to neurology since appeal was denied

## 2021-06-14 NOTE — Telephone Encounter (Signed)
Please advise what next step I should take. Dr. Redmond School says fix it and I am not sure what needs to be done. I would say next step is neurology

## 2021-06-14 NOTE — Addendum Note (Signed)
Addended by: Minette Headland A on: 06/14/2021 05:10 PM   Modules accepted: Orders

## 2021-06-15 NOTE — Telephone Encounter (Signed)
It appears Neurology would be next step.

## 2021-06-22 ENCOUNTER — Ambulatory Visit: Payer: 59 | Admitting: Family Medicine

## 2021-06-22 ENCOUNTER — Other Ambulatory Visit: Payer: Self-pay

## 2021-06-22 VITALS — BP 172/96 | HR 72 | Temp 98.3°F | Wt 279.0 lb

## 2021-06-22 DIAGNOSIS — F489 Nonpsychotic mental disorder, unspecified: Secondary | ICD-10-CM

## 2021-06-22 NOTE — Progress Notes (Signed)
Not seen.He was rescheduled

## 2021-06-23 ENCOUNTER — Telehealth: Payer: Self-pay | Admitting: Family Medicine

## 2021-06-23 NOTE — Telephone Encounter (Signed)
Received requested records from Dr. Terrence Dupont.

## 2021-06-28 ENCOUNTER — Telehealth: Payer: Self-pay | Admitting: Internal Medicine

## 2021-06-28 DIAGNOSIS — G4719 Other hypersomnia: Secondary | ICD-10-CM

## 2021-06-28 DIAGNOSIS — G479 Sleep disorder, unspecified: Secondary | ICD-10-CM

## 2021-06-28 NOTE — Telephone Encounter (Signed)
Neurology will not handle patient sleep study issues. This needs to go through Pulmonary with Dr. Annamaria Boots. Ok to referr to Dr. Annamaria Boots

## 2021-06-28 NOTE — Telephone Encounter (Signed)
I went ahead referred him so we can try to get him urgently

## 2021-08-05 ENCOUNTER — Telehealth: Payer: Self-pay

## 2021-08-12 ENCOUNTER — Encounter: Payer: Self-pay | Admitting: Family Medicine

## 2021-08-12 ENCOUNTER — Other Ambulatory Visit: Payer: Self-pay

## 2021-08-12 ENCOUNTER — Ambulatory Visit (INDEPENDENT_AMBULATORY_CARE_PROVIDER_SITE_OTHER): Payer: 59 | Admitting: Family Medicine

## 2021-08-12 VITALS — BP 160/98 | HR 73 | Temp 98.4°F | Wt 273.4 lb

## 2021-08-12 DIAGNOSIS — I152 Hypertension secondary to endocrine disorders: Secondary | ICD-10-CM

## 2021-08-12 DIAGNOSIS — Z9119 Patient's noncompliance with other medical treatment and regimen: Secondary | ICD-10-CM

## 2021-08-12 DIAGNOSIS — I4891 Unspecified atrial fibrillation: Secondary | ICD-10-CM

## 2021-08-12 DIAGNOSIS — E785 Hyperlipidemia, unspecified: Secondary | ICD-10-CM

## 2021-08-12 DIAGNOSIS — E1159 Type 2 diabetes mellitus with other circulatory complications: Secondary | ICD-10-CM

## 2021-08-12 DIAGNOSIS — Z91199 Patient's noncompliance with other medical treatment and regimen due to unspecified reason: Secondary | ICD-10-CM

## 2021-08-12 LAB — POCT GLYCOSYLATED HEMOGLOBIN (HGB A1C): Hemoglobin A1C: 6.5 % — AB (ref 4.0–5.6)

## 2021-08-12 MED ORDER — METOPROLOL SUCCINATE ER 25 MG PO TB24: 25.0000 mg | ORAL_TABLET | Freq: Every day | ORAL | 11 refills | Status: DC

## 2021-08-12 NOTE — Progress Notes (Signed)
  Subjective:    Patient ID: Stanley Wilkerson, male    DOB: December 02, 1965, 55 y.o.   MRN: [id number]  Stanley Wilkerson is a 55 y.o. male who presents for follow-up of Type 2 diabetes mellitus.  Home blood sugar records:  not cecking Current symptoms/problems include none and have been unchanged. Daily foot checks: yes   Any foot concerns: right great toe tendeness Exercise: The patient has a physically strenuous job, but has no regular exercise apart from work.  Diet: fair He has a previous history of atrial fibrillation and has been followed by cardiology.  Presently he is on Eliquis, amiodarone, metoprolol, losartan and atorvastatin.  He is apparently not taking the atorvastatin on a regular basis and there is a question of how accurately he is taking any of his medications as he does express some hesitancy on his medications.  He has been followed by cardiology. The following portions of the patient's history were reviewed and updated as appropriate: allergies, current medications, past medical history, past social history and problem list.  ROS as in subjective above.     Objective:    Physical Exam Alert and in no distress otherwise not examined.  Weight 273 lb 6.4 oz (124 kg). Hemoglobin A1c is 6.5 Lab Review Diabetic Labs Latest Ref Rng & Units 05/10/2021 02/07/2021 01/26/2021 11/29/2020 11/28/2020  HbA1c 4.0 - 5.6 % 6.6(A) - 6.9(A) 6.2(H) -  Chol 100 - 199 mg/dL 202(H) - - 194 -  HDL >39 mg/dL 56 - - 41 -  Calc LDL 0 - 99 mg/dL 129(H) - - 133(H) -  Triglycerides 0 - 149 mg/dL 92 - - 102 -  Creatinine 0.61 - 1.24 mg/dL - 1.08 - 0.92 1.11   BP/Weight 08/12/2021 06/22/2021 05/10/2021 [id number]  Systolic BP - [id number] [id number]  Diastolic BP - 96 90 85 98  Wt. (Lbs) 273.4 279 273.4 265 274.8  BMI 42.82 43.7 42.82 41.5 43.04   Foot/eye exam completion dates 05/10/2021  Foot Form Completion Done    Stanley Wilkerson  reports that he has quit smoking. He has never used smokeless  tobacco. He reports that he does not drink alcohol and does not use drugs.     Assessment & Plan:  Type 2 diabetes mellitus with other circulatory complication, without long-term current use of insulin (HCC) - Plan: POCT glycosylated hemoglobin (Hb A1C)  Hypertension associated with diabetes ([place]) - Plan: metoprolol succinate (TOPROL XL) 25 MG 24 hr tablet  Morbid obesity (HCC)  Hyperlipidemia LDL goal <70 - Plan: Lipid panel  Atrial fibrillation, unspecified type [facility])  Personal history of noncompliance with medical treatment, presenting hazards to health  Rx changes: none Education: Reviewed 'ABCs' of diabetes management (respective goals in parentheses):  A1C (<7), blood pressure (<130/80), and cholesterol (LDL <100). Compliance at present is estimated to be poor. Efforts to improve compliance (if necessary) will be directed at increased exercise. Follow up: 4 months  He is definitely not compliant with taking his medications as specifically metoprolol and possibly the atorvastatin.  I will have her home health agency pharmacist work with him on getting the correct dosing on this and then follow-up based on that.

## 2021-08-13 ENCOUNTER — Telehealth: Payer: Self-pay

## 2021-08-13 LAB — LIPID PANEL
Chol/HDL Ratio: 3.7 ratio (ref 0.0–5.0)
Cholesterol, Total: 178 mg/dL (ref 100–199)
HDL: 48 mg/dL (ref >39)
LDL Chol Calc (NIH): 108 mg/dL — ABNORMAL HIGH (ref 0–99)
Triglycerides: 124 mg/dL (ref 0–149)
VLDL Cholesterol Cal: 22 mg/dL (ref 5–40)

## 2021-08-13 NOTE — Telephone Encounter (Signed)
Rn from [facility] call to get update on pt  visit 08/12/21. Called her back no answer. [place]

## 2021-09-17 NOTE — Telephone Encounter (Signed)
error 

## 2021-12-13 ENCOUNTER — Ambulatory Visit: Payer: 59 | Admitting: Family Medicine

## 2021-12-13 NOTE — Progress Notes (Deleted)
°  Subjective:    Patient ID: Stanley Wilkerson, male    DOB: Jan 27, 1966, 56 y.o.   MRN: [id number]  Christoher Drudge is a 56 y.o. male who presents for follow-up of Type 2 diabetes mellitus.  Home blood sugar records: {diabetes glucometry results:16657} Current symptoms/problems include {Symptoms; diabetes:14075} and have been {Desc; course:15616}. Daily foot checks:   Any foot concerns: *** Exercise: {types:19826} Diet: The following portions of the patient's history were reviewed and updated as appropriate: allergies, current medications, past medical history, past social history and problem list.  ROS as in subjective above.     Objective:    Physical Exam Alert and in no distress otherwise not examined.  There were no vitals taken for this visit.  Lab Review Diabetic Labs Latest Ref Rng & Units 08/12/2021 05/10/2021 02/07/2021 01/26/2021 11/29/2020  HbA1c 4.0 - 5.6 % 6.5(A) 6.6(A) - 6.9(A) 6.2(H)  Chol 100 - 199 mg/dL 178 202(H) - - 194  HDL >39 mg/dL 48 56 - - 41  Calc LDL 0 - 99 mg/dL 108(H) 129(H) - - 133(H)  Triglycerides 0 - 149 mg/dL 124 92 - - 102  Creatinine 0.61 - 1.24 mg/dL - - 1.08 - 0.92   BP/Weight 08/12/2021 06/22/2021 05/10/2021 7/61/6073 05/28/625  Systolic BP 948 546 270 350 093  Diastolic BP 98 96 90 85 98  Wt. (Lbs) 273.4 279 273.4 265 274.8  BMI 42.82 43.7 42.82 41.5 43.04   Foot/eye exam completion dates 05/10/2021  Foot Form Completion Done    Robi  reports that he has quit smoking. He has never used smokeless tobacco. He reports that he does not drink alcohol and does not use drugs.     Assessment & Plan:    No diagnosis found.  Rx changes: {none:33079} Education: Reviewed ABCs of diabetes management (respective goals in parentheses):  A1C (<7), blood pressure (<130/80), and cholesterol (LDL <100). Compliance at present is estimated to be {good/fair/poor:33178}. Efforts to improve compliance (if necessary) will be directed at  {compliance:16716}. Follow up: {NUMBERS; 0-10:33138} {time:11}

## 2022-02-11 ENCOUNTER — Telehealth: Payer: Self-pay

## 2022-02-11 NOTE — Telephone Encounter (Signed)
Pt states Eliquis over $100, can't afford, activated co pay card, called pharmacy and there was active Rx on file, gave co pay info and went down to $10.  Pt informed

## 2022-03-21 ENCOUNTER — Ambulatory Visit: Payer: 59 | Admitting: Family Medicine

## 2022-03-21 ENCOUNTER — Encounter: Payer: Self-pay | Admitting: Family Medicine

## 2022-03-21 VITALS — BP 152/88 | HR 84 | Temp 98.1°F | Wt 278.2 lb

## 2022-03-21 DIAGNOSIS — I4891 Unspecified atrial fibrillation: Secondary | ICD-10-CM | POA: Diagnosis not present

## 2022-03-21 DIAGNOSIS — E1159 Type 2 diabetes mellitus with other circulatory complications: Secondary | ICD-10-CM | POA: Diagnosis not present

## 2022-03-21 DIAGNOSIS — I152 Hypertension secondary to endocrine disorders: Secondary | ICD-10-CM

## 2022-03-21 DIAGNOSIS — R058 Other specified cough: Secondary | ICD-10-CM | POA: Diagnosis not present

## 2022-03-21 DIAGNOSIS — E785 Hyperlipidemia, unspecified: Secondary | ICD-10-CM

## 2022-03-21 LAB — POCT GLYCOSYLATED HEMOGLOBIN (HGB A1C): Hemoglobin A1C: 6.5 % — AB (ref 4.0–5.6)

## 2022-03-21 MED ORDER — DOXYCYCLINE HYCLATE 100 MG PO TABS: 100.0000 mg | ORAL_TABLET | Freq: Two times a day (BID) | ORAL | 0 refills | Status: DC

## 2022-03-21 NOTE — Patient Instructions (Signed)
Start taking the medications but take them adding 1 new drug every 3 days to see if you can help figure out which ones causing trouble ?

## 2022-03-21 NOTE — Progress Notes (Addendum)
? ?  Subjective:  ? ? Patient ID: Stanley Wilkerson, male    DOB: November 11, 1966, 56 y.o.   MRN: [id number] ? ?HPI ?He is here for evaluation of continued difficulty with cough and congestion.  He does have a productive cough but no fever, chills, sore throat or earache.  He has an underlying history of atrial fibrillation but is been taking his Eliquis on an intermittent basis.  He also is taking Aldactone intermittently.  He states that he has had difficulty with his medications making him feel bad but was vague about this.  He is on losartan and amiodarone.  He also has underlying diabetes and presently is on no medication.  There have been several attempts to get evaluated for sleep apnea but so far he has been able to accomplish this at home and got turned down for an in-hospital study.  He does see his cardiologist regularly. ? ? ?Review of Systems ? ?   ?Objective:  ? Physical Exam ?Alert and in no distress. Tympanic membranes and canals are normal. Pharyngeal area is normal. Neck is supple without adenopathy or thyromegaly. Cardiac exam shows an irregular sinus rhythm without murmurs or gallops. Lungs show scattered rhonchi especially at the bases. ?Hemoglobin A1c 6.5 ? ? ? ?   ?Assessment & Plan:  ?Type 2 diabetes mellitus with other circulatory complication, without long-term current use of insulin (HCC) - Plan: CBC with Differential/Platelet, Comprehensive metabolic panel, Lipid panel, POCT glycosylated hemoglobin (Hb A1C) ? ?Atrial fibrillation, unspecified type ([place]) ? ?Productive cough - Plan: DG Chest 2 View, doxycycline (VIBRA-TABS) 100 MG tablet ? ?Hypertension associated with diabetes ([place]) ? ?Morbid obesity ([place]) - Plan: CBC with Differential/Platelet, Comprehensive metabolic panel, Lipid panel ? ?Hyperlipidemia LDL goal <70 - Plan: Lipid panel ?Start taking the medications but take them adding 1 new drug every 3 days to see if you can help figure out which ones causing trouble recheck here in 1  month. ? ?

## 2022-03-22 ENCOUNTER — Ambulatory Visit
Admission: RE | Admit: 2022-03-22 | Discharge: 2022-03-22 | Disposition: A | Discharge status: Home / Self Care | Payer: 59 | Source: Ambulatory Visit | Attending: Family Medicine | Admitting: Family Medicine

## 2022-03-23 LAB — COMPREHENSIVE METABOLIC PANEL
ALT: 21 IU/L (ref 0–44)
AST: 19 IU/L (ref 0–40)
Albumin/Globulin Ratio: 1.4 (ref 1.2–2.2)
Albumin: 3.7 g/dL — ABNORMAL LOW (ref 3.8–4.9)
Alkaline Phosphatase: 70 IU/L (ref 44–121)
BUN/Creatinine Ratio: 15 (ref 9–20)
BUN: 17 mg/dL (ref 6–24)
Bilirubin Total: 0.5 mg/dL (ref 0.0–1.2)
CO2: 27 mmol/L (ref 20–29)
Calcium: 8.7 mg/dL (ref 8.7–10.2)
Chloride: 101 mmol/L (ref 96–106)
Creatinine, Ser: 1.11 mg/dL (ref 0.76–1.27)
Globulin, Total: 2.7 g/dL (ref 1.5–4.5)
Glucose: 110 mg/dL — ABNORMAL HIGH (ref 70–99)
Potassium: 3.6 mmol/L (ref 3.5–5.2)
Sodium: 142 mmol/L (ref 134–144)
Total Protein: 6.4 g/dL (ref 6.0–8.5)
eGFR: 78 mL/min/{1.73_m2} (ref >59)

## 2022-03-23 LAB — CBC WITH DIFFERENTIAL/PLATELET
Basophils Absolute: 0.1 10*3/uL (ref 0.0–0.2)
Basos: 1 %
EOS (ABSOLUTE): 0.4 10*3/uL (ref 0.0–0.4)
Eos: 5 %
Hematocrit: 37.6 % (ref 37.5–51.0)
Hemoglobin: 12.5 g/dL — ABNORMAL LOW (ref 13.0–17.7)
Immature Grans (Abs): 0 10*3/uL (ref 0.0–0.1)
Immature Granulocytes: 0 %
Lymphocytes Absolute: 1.7 10*3/uL (ref 0.7–3.1)
Lymphs: 21 %
MCH: 29.6 pg (ref 26.6–33.0)
MCHC: 33.2 g/dL (ref 31.5–35.7)
MCV: 89 fL (ref 79–97)
Monocytes Absolute: 0.5 10*3/uL (ref 0.1–0.9)
Monocytes: 6 %
Neutrophils Absolute: 5.6 10*3/uL (ref 1.4–7.0)
Neutrophils: 67 %
Platelets: 321 10*3/uL (ref 150–450)
RBC: 4.22 x10E6/uL (ref 4.14–5.80)
RDW: 13.5 % (ref 11.6–15.4)
WBC: 8.2 10*3/uL (ref 3.4–10.8)

## 2022-03-23 LAB — LIPID PANEL
Chol/HDL Ratio: 4.4 ratio (ref 0.0–5.0)
Cholesterol, Total: 146 mg/dL (ref 100–199)
HDL: 33 mg/dL — ABNORMAL LOW (ref >39)
LDL Chol Calc (NIH): 100 mg/dL — ABNORMAL HIGH (ref 0–99)
Triglycerides: 65 mg/dL (ref 0–149)
VLDL Cholesterol Cal: 13 mg/dL (ref 5–40)

## 2022-04-13 IMAGING — DX DG CHEST 2V
2 series · 2 of 2 positions shown · non-contrast
Comparison: 03/26/2017

CLINICAL DATA: Atrial fibrillation

EXAM:
CHEST - 2 VIEW

[chest pa]
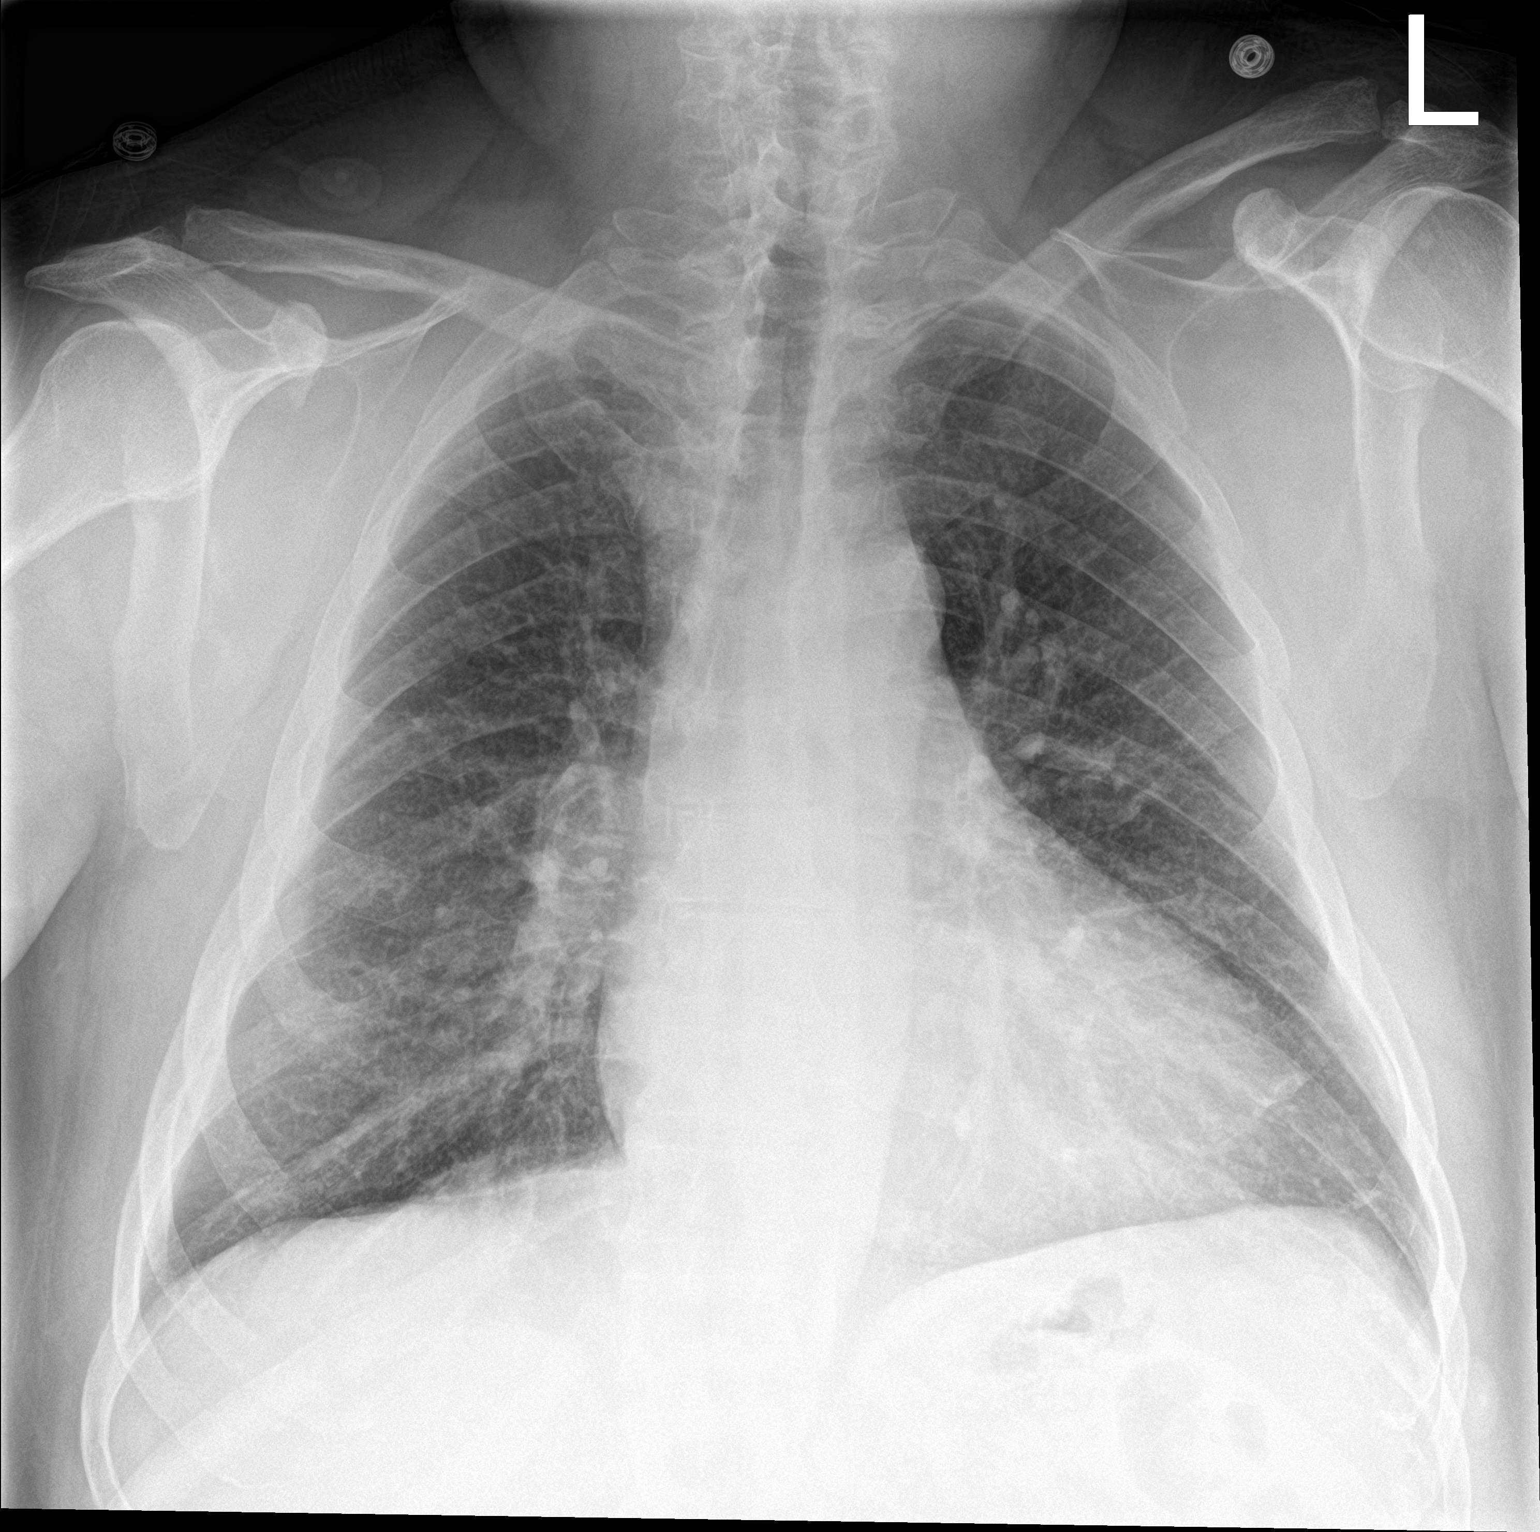

[chest lat]
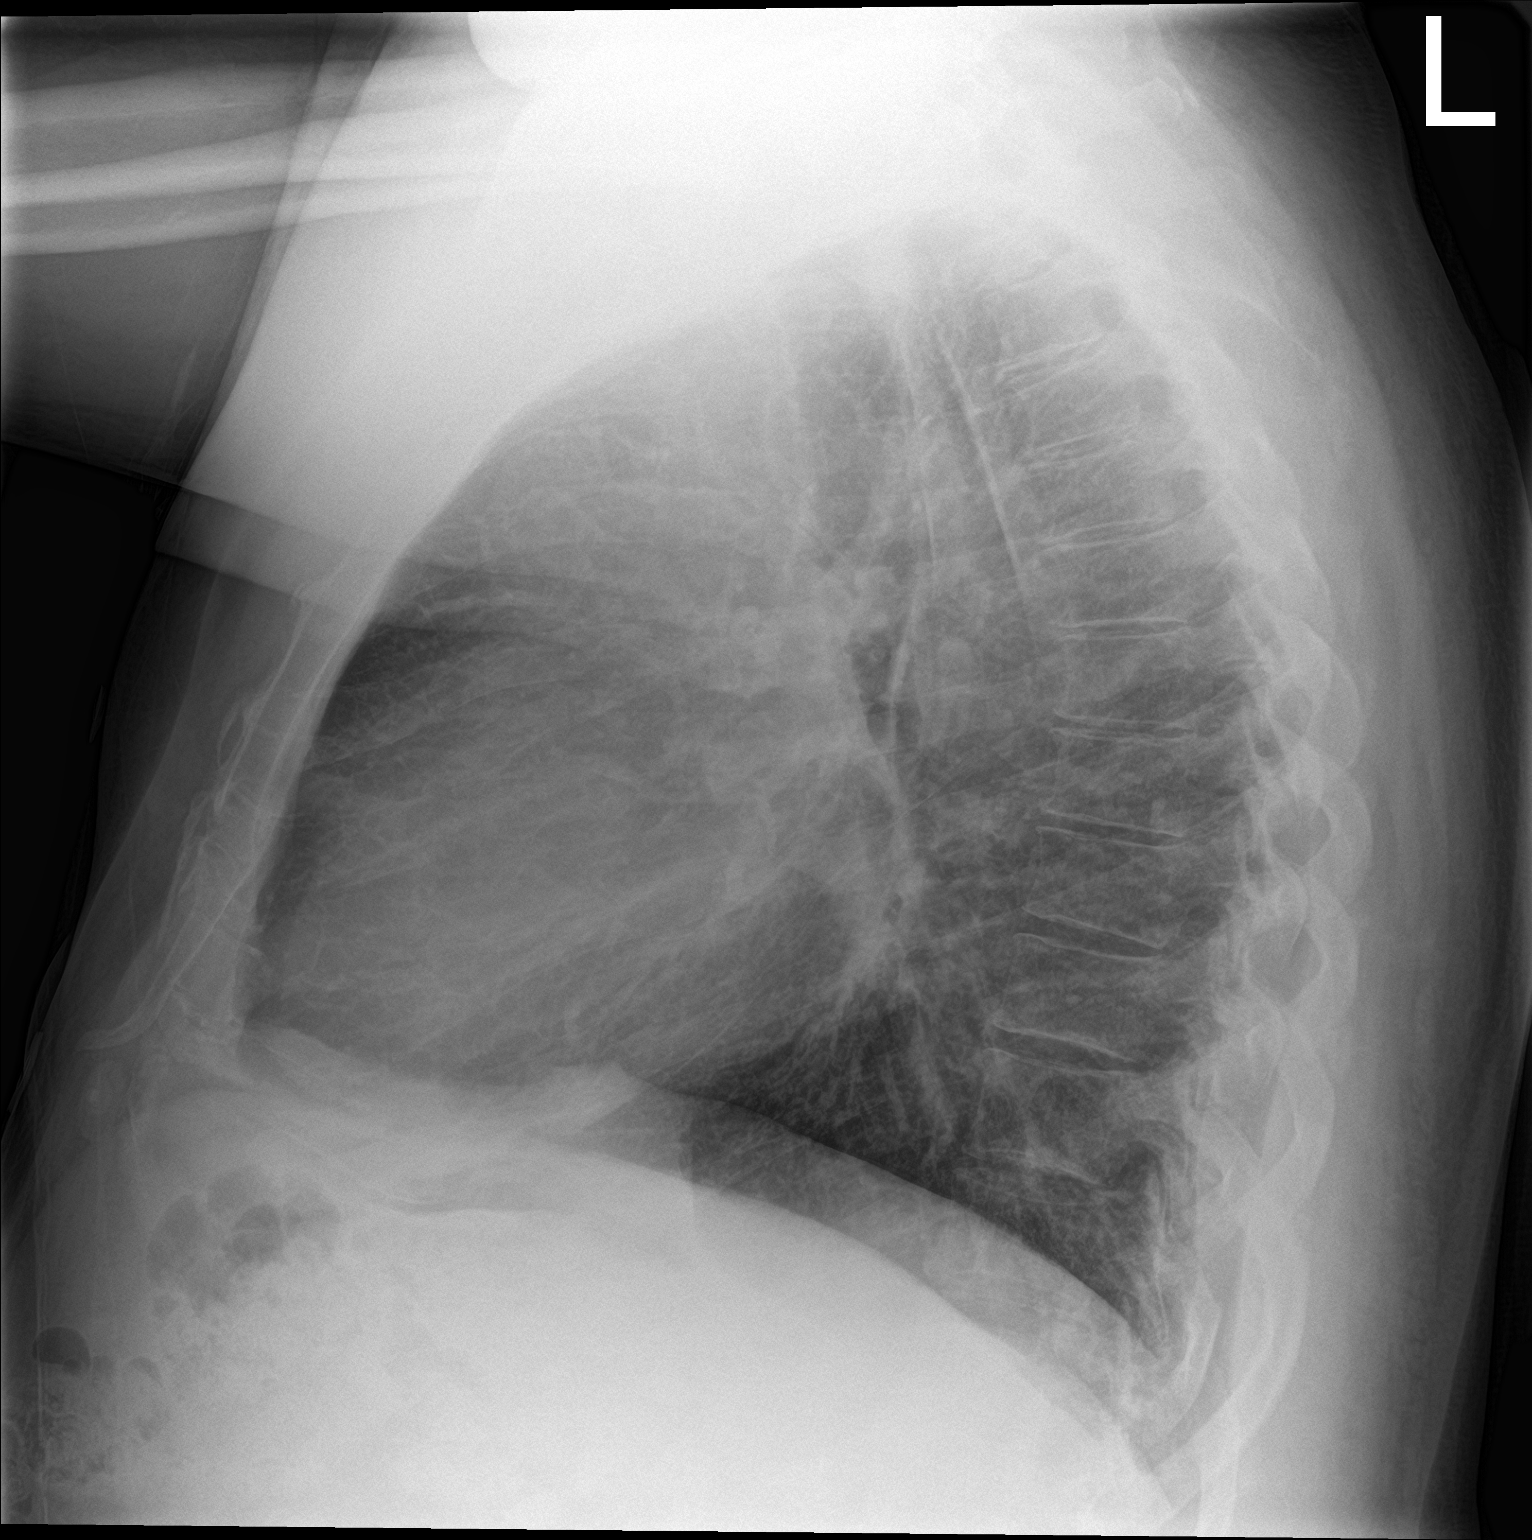

[2 of 2 positions shown; findings below may reference images not displayed]

FINDINGS: The heart size and mediastinal contours are within normal limits.
Coarsened interstitial markings bilaterally, most pronounced within
the bibasilar regions. No focal airspace consolidation. No pleural
effusion or pneumothorax. The visualized skeletal structures are
unremarkable.
IMPRESSION: Coarsened interstitial markings bilaterally, most pronounced within
the bibasilar regions, suspicious for bronchitic lung changes.

## 2022-04-19 ENCOUNTER — Ambulatory Visit: Payer: 59 | Admitting: Family Medicine

## 2022-04-20 ENCOUNTER — Encounter: Payer: Self-pay | Admitting: Family Medicine

## 2022-06-15 ENCOUNTER — Encounter: Payer: 59 | Admitting: Family Medicine

## 2022-06-16 ENCOUNTER — Encounter: Payer: Self-pay | Admitting: Family Medicine

## 2022-06-16 ENCOUNTER — Ambulatory Visit: Payer: 59 | Admitting: Family Medicine

## 2022-06-16 VITALS — BP 160/96 | HR 86 | Temp 97.5°F | Ht 66.75 in | Wt 278.8 lb

## 2022-06-16 DIAGNOSIS — E1159 Type 2 diabetes mellitus with other circulatory complications: Secondary | ICD-10-CM | POA: Diagnosis not present

## 2022-06-16 DIAGNOSIS — H409 Unspecified glaucoma: Secondary | ICD-10-CM

## 2022-06-16 DIAGNOSIS — E785 Hyperlipidemia, unspecified: Secondary | ICD-10-CM

## 2022-06-16 DIAGNOSIS — G4720 Circadian rhythm sleep disorder, unspecified type: Secondary | ICD-10-CM | POA: Diagnosis not present

## 2022-06-16 DIAGNOSIS — N529 Male erectile dysfunction, unspecified: Secondary | ICD-10-CM

## 2022-06-16 DIAGNOSIS — I4891 Unspecified atrial fibrillation: Secondary | ICD-10-CM

## 2022-06-16 DIAGNOSIS — E1121 Type 2 diabetes mellitus with diabetic nephropathy: Secondary | ICD-10-CM | POA: Diagnosis not present

## 2022-06-16 DIAGNOSIS — I152 Hypertension secondary to endocrine disorders: Secondary | ICD-10-CM

## 2022-06-16 DIAGNOSIS — Z0000 Encounter for general adult medical examination without abnormal findings: Secondary | ICD-10-CM

## 2022-06-16 DIAGNOSIS — Z9842 Cataract extraction status, left eye: Secondary | ICD-10-CM

## 2022-06-16 LAB — POCT GLYCOSYLATED HEMOGLOBIN (HGB A1C): Hemoglobin A1C: 6.9 % — AB (ref 4.0–5.6)

## 2022-06-16 LAB — POCT UA - MICROALBUMIN
Albumin/Creatinine Ratio, Urine, POC: 64.2
Creatinine, POC: 107.7 mg/dL
Microalbumin Ur, POC: 69.1 mg/L

## 2022-06-16 MED ORDER — LOSARTAN POTASSIUM-HCTZ 100-12.5 MG PO TABS: 1.0000 | ORAL_TABLET | Freq: Every day | ORAL | 3 refills | Status: DC

## 2022-06-16 MED ORDER — SILDENAFIL CITRATE 100 MG PO TABS: 100.0000 mg | ORAL_TABLET | Freq: Every day | ORAL | 11 refills | Status: DC | PRN

## 2022-06-16 MED ORDER — METOPROLOL SUCCINATE ER 50 MG PO TB24: 50.0000 mg | ORAL_TABLET | Freq: Every day | ORAL | 3 refills | Status: DC

## 2022-06-16 MED ORDER — ATORVASTATIN CALCIUM 80 MG PO TABS: 80.0000 mg | ORAL_TABLET | Freq: Every day | ORAL | 3 refills | Status: DC

## 2022-06-16 NOTE — Progress Notes (Signed)
Complete physical exam  Patient: Stanley Wilkerson   DOB: 01-Nov-1966   56 y.o. Male  MRN: [id number]  Subjective:    Chief Complaint  Patient presents with   Annual Exam    Non fasting     Stanley Wilkerson is a 56 y.o. male who presents today for a complete physical exam. He reports consuming a general diet.  Yard work QD  He generally feels well. He reports sleeping poorly. He continues to have sleep related issues.  He now sometimes has intermittent gasping for air.  At this point is intermittent in nature and he is really having no difficulty with shortness of breath or DOE during the day.  He has had difficulty getting a sleep study done.  He follows up regularly with cardiology.  He does have underlying atrial fibs and presently is taking Pacerone and Eliquis.  He continues on metoprolol, spironolactone as well as losartan/HCTZ.  He would like a refill on his sildenafil although he is not sexually active.  He was given nitroglycerin but is not using it.  He had has a history of glaucoma as well as cataract surgery but is not happy with the ophthalmologist that he was seeing and would like to switch to someone different.  His diet and exercise are unchanged.  He does not smoke.  He has been having ongoing difficulty with sleep as well as fatigue.  In the past we have tried to do a couple of home sleep test but were unsuccessful.  Otherwise his family and social history as well as health maintenance and immunizations was reviewed   Most recent fall risk assessment:    06/16/2022    8:41 AM  [place] in the past year? 0  Number falls in past yr: 0  Injury with Fall? 0  Follow up Falls evaluation completed     Most recent depression screenings:    06/16/2022    8:41 AM 06/22/2021    4:00 PM  PHQ 2/9 Scores  PHQ - 2 Score 0 0      Patient Active Problem List   Diagnosis Date Noted   Morbid obesity ([place]) 01/26/2021   History of COVID-19 01/26/2021   Glaucoma  01/26/2021   History of right cataract extraction 01/26/2021   Atrial fibrillation ([place]) 11/28/2020   Type 2 diabetes mellitus ([place]) 05/26/2020   Family history of heart disease in male family member before age 53 12/01/2014   Hyperlipidemia LDL goal <70 12/01/2014   LVH (left ventricular hypertrophy) 08/26/2014   Hypertension associated with diabetes ([place]) 08/26/2014   Past Medical History:  Diagnosis Date   Hypertension    Obesity    Past Surgical History:  Procedure Laterality Date   CATARACT EXTRACTION Right 07/23/2020   Social History   Tobacco Use   Smoking status: Former   Smokeless tobacco: Never  Substance Use Topics   Alcohol use: No   Drug use: No   Family History  Problem Relation Age of Onset   Heart disease Father    Allergies  Allergen Reactions   Penicillins Other (See Comments)    From childhood- reaction not recalled      Patient Care Team: Denita Lung, MD as PCP - General (Family Medicine)   Outpatient Medications Prior to Visit  Medication Sig   amiodarone (PACERONE) 200 MG tablet Take 1 tablet (200 mg total) by mouth daily.   apixaban (ELIQUIS) 5 MG TABS tablet Take 1 tablet (  5 mg total) by mouth 2 (two) times daily.   atorvastatin (LIPITOR) 80 MG tablet Take 1 tablet (80 mg total) by mouth daily.   losartan-hydrochlorothiazide (HYZAAR) 100-12.5 MG tablet Take 1 tablet by mouth daily.   metoprolol succinate (TOPROL XL) 25 MG 24 hr tablet Take 1 tablet (25 mg total) by mouth daily.   Accu-Chek Softclix Lancets lancets 1 each by Other route 2 (two) times daily at 8 am and 10 pm. Use as instructed (Patient not taking: Reported on 03/21/2022)   dorzolamide-timolol (COSOPT) 22.3-6.8 MG/ML ophthalmic solution Place 1 drop into both eyes 2 (two) times daily. (Patient not taking: Reported on 08/12/2021)   doxycycline (VIBRA-TABS) 100 MG tablet Take 1 tablet (100 mg total) by mouth 2 (two) times daily. (Patient not taking: Reported on 06/16/2022)    glucose blood test strip 1 each by Other route as needed for other. Use as instructed to check blood sugar daily (Patient not taking: Reported on 03/21/2022)   nitroGLYCERIN (NITROSTAT) 0.4 MG SL tablet Place 1 tablet (0.4 mg total) under the tongue every 5 (five) minutes x 3 doses as needed for chest pain. (Patient not taking: Reported on 06/16/2022)   ondansetron (ZOFRAN ODT) 8 MG disintegrating tablet Take 1 tablet (8 mg total) by mouth every 8 (eight) hours as needed for nausea or vomiting. (Patient not taking: Reported on 05/10/2021)   potassium chloride SA (KLOR-CON) 20 MEQ tablet Take 1 tablet (20 mEq total) by mouth 2 (two) times daily for 5 days. (Patient not taking: Reported on 06/22/2021)   sildenafil (REVATIO) 20 MG tablet See admin instructions. (Patient not taking: Reported on 08/12/2021)   spironolactone (ALDACTONE) 25 MG tablet Take 25 mg by mouth daily.   No facility-administered medications prior to visit.    Review of Systems  All other systems reviewed and are negative.         Objective:     BP (!) 160/96   Pulse 86   Temp (!) 97.5 F (36.4 C)   Ht 5' 6.75" (1.695 m)   Wt 278 lb 12.8 oz (126.5 kg)   SpO2 94%   BMI 43.99 kg/m  BP Readings from Last 3 Encounters:  06/16/22 (!) 160/96  03/21/22 (!) 152/88  08/12/21 (!) 160/98   Wt Readings from Last 3 Encounters:  06/16/22 278 lb 12.8 oz (126.5 kg)  03/21/22 278 lb 3.2 oz (126.2 kg)  08/12/21 273 lb 6.4 oz (124 kg)      Physical Exam   Alert and in no distress. Tympanic membranes and canals are normal. Pharyngeal area is normal. Neck is supple without adenopathy or thyromegaly. Cardiac exam shows a regular sinus rhythm without murmurs or gallops. Lungs are clear to auscultation.  Foot exam is normal. Epworth sleepiness scale score of 23 neck size 18.5 Last CBC Lab Results  Component Value Date   WBC 8.2 03/21/2022   HGB 12.5 (L) 03/21/2022   HCT 37.6 03/21/2022   MCV 89 03/21/2022   MCH 29.6  03/21/2022   RDW 13.5 03/21/2022   PLT 321 69/67/8938   Last metabolic panel Lab Results  Component Value Date   GLUCOSE 110 (H) 03/21/2022   NA 142 03/21/2022   K 3.6 03/21/2022   CL 101 03/21/2022   CO2 27 03/21/2022   BUN 17 03/21/2022   CREATININE 1.11 03/21/2022   EGFR 78 03/21/2022   CALCIUM 8.7 03/21/2022   PROT 6.4 03/21/2022   ALBUMIN 3.7 (L) 03/21/2022   LABGLOB 2.7 03/21/2022  AGRATIO 1.4 03/21/2022   BILITOT 0.5 03/21/2022   ALKPHOS 70 03/21/2022   AST 19 03/21/2022   ALT 21 03/21/2022   ANIONGAP 12 02/07/2021   Last lipids Lab Results  Component Value Date   CHOL 146 03/21/2022   HDL 33 (L) 03/21/2022   LDLCALC 100 (H) 03/21/2022   TRIG 65 03/21/2022   CHOLHDL 4.4 03/21/2022   Last hemoglobin A1c Lab Results  Component Value Date   HGBA1C 6.5 (A) 03/21/2022   Last thyroid functions Lab Results  Component Value Date   TSH 0.979 11/29/2020  Hemoglobin A1c is 6.9 Last vitamin D No results found for: "25OHVITD2", "25OHVITD3", "VD25OH"      Assessment & Plan:   Routine general medical examination at a health care facility - Plan: CBC with Differential/Platelet, Comprehensive metabolic panel, Lipid panel  Type 2 diabetes mellitus with other circulatory complication, without long-term current use of insulin ([place]) - Plan: POCT glycosylated hemoglobin (Hb A1C), POCT UA - Microalbumin, CBC with Differential/Platelet, Comprehensive metabolic panel, Lipid panel, Ambulatory referral to Ophthalmology  Atrial fibrillation, unspecified type ([place])  Hypertension associated with diabetes ([place]) - Plan: CBC with Differential/Platelet, Comprehensive metabolic panel  Morbid obesity ([place]) - Plan: CBC with Differential/Platelet, Comprehensive metabolic panel, Lipid panel  Hyperlipidemia LDL goal <70 - Plan: Lipid panel  Glaucoma, unspecified glaucoma type, unspecified laterality - Plan: Ambulatory referral to Ophthalmology  History of left cataract extraction -  Plan: Ambulatory referral to Ophthalmology  Vasculogenic erectile dysfunction, unspecified vasculogenic erectile dysfunction type - Plan: sildenafil (VIAGRA) 100 MG tablet Cautioned to not use the sildenafil if he uses nitroglycerin.  He states that he has never used it.  Discussed diet and exercise with him.  Strongly encouraged him to stay on all of his medications.  He apparently does occasionally skip the second dose of the Eliquis.  Explained that this puts himself at risk for stroke.  Since he was not happy with his ophthalmologist I will refer him to a different group.  His blood pressure is not adequately controlled so I will increase the Toprol.  I will again try to get a sleep study on him on this 1 doing the hospital since he was flunked to outpatient studies.  Immunization History  Administered Date(s) Administered   Tdap 02/07/2019    Health Maintenance  Topic Date Due   OPHTHALMOLOGY EXAM  Never done   Zoster Vaccines- Shingrix (1 of 2) 09/17/2023 (Originally 05/15/2016)   INFLUENZA VACCINE  06/28/2022   HEMOGLOBIN A1C  09/20/2022   Fecal DNA (Cologuard)  05/16/2023   FOOT EXAM  06/17/2023   TETANUS/TDAP  02/06/2029   Hepatitis C Screening  Completed   HIV Screening  Completed   HPV VACCINES  Aged Out   COLONOSCOPY (Pts 45-61yrs Insurance coverage will need to be confirmed)  Discontinued   COVID-19 Vaccine  Discontinued  We will also follow-up concerning further testing for possible OSA.  Discussed health benefits of physical activity, and encouraged him to engage in regular exercise appropriate for his age and condition.  Problem List Items Addressed This Visit     Atrial fibrillation ([place])   Relevant Medications   sildenafil (VIAGRA) 100 MG tablet   Glaucoma   Relevant Orders   Ambulatory referral to Ophthalmology   Hyperlipidemia LDL goal <70   Relevant Medications   sildenafil (VIAGRA) 100 MG tablet   Other Relevant Orders   Lipid panel   Hypertension  associated with diabetes ([place])   Relevant Medications   sildenafil (  VIAGRA) 100 MG tablet   Other Relevant Orders   CBC with Differential/Platelet   Comprehensive metabolic panel   Morbid obesity ([place])   Relevant Orders   CBC with Differential/Platelet   Comprehensive metabolic panel   Lipid panel   Type 2 diabetes mellitus ([place])   Relevant Orders   POCT glycosylated hemoglobin (Hb A1C)   POCT UA - Microalbumin   CBC with Differential/Platelet   Comprehensive metabolic panel   Lipid panel   Ambulatory referral to Ophthalmology   Other Visit Diagnoses     Routine general medical examination at a health care facility    -  Primary   Relevant Orders   CBC with Differential/Platelet   Comprehensive metabolic panel   Lipid panel   History of left cataract extraction       Relevant Orders   Ambulatory referral to Ophthalmology   Vasculogenic erectile dysfunction, unspecified vasculogenic erectile dysfunction type       Relevant Medications   sildenafil (VIAGRA) 100 MG tablet      Return in about 3 months (around 09/16/2022) for diabetes and one year for cpe .     Jill Alexanders, MD

## 2022-06-16 NOTE — Patient Instructions (Signed)
Health Maintenance, Male Adopting a healthy lifestyle and getting preventive care are important in promoting health and wellness. Ask your health care provider about: The right schedule for you to have regular tests and exams. Things you can do on your own to prevent diseases and keep yourself healthy. What should I know about diet, weight, and exercise? Eat a healthy diet  Eat a diet that includes plenty of vegetables, fruits, low-fat dairy products, and lean protein. Do not eat a lot of foods that are high in solid fats, added sugars, or sodium. Maintain a healthy weight Body mass index (BMI) is a measurement that can be used to identify possible weight problems. It estimates body fat based on height and weight. Your health care provider can help determine your BMI and help you achieve or maintain a healthy weight. Get regular exercise Get regular exercise. This is one of the most important things you can do for your health. Most adults should: Exercise for at least 150 minutes each week. The exercise should increase your heart rate and make you sweat (moderate-intensity exercise). Do strengthening exercises at least twice a week. This is in addition to the moderate-intensity exercise. Spend less time sitting. Even light physical activity can be beneficial. Watch cholesterol and blood lipids Have your blood tested for lipids and cholesterol at 56 years of age, then have this test every 5 years. You may need to have your cholesterol levels checked more often if: Your lipid or cholesterol levels are high. You are older than 56 years of age. You are at high risk for heart disease. What should I know about cancer screening? Many types of cancers can be detected early and may often be prevented. Depending on your health history and family history, you may need to have cancer screening at various ages. This may include screening for: Colorectal cancer. Prostate cancer. Skin cancer. Lung  cancer. What should I know about heart disease, diabetes, and high blood pressure? Blood pressure and heart disease High blood pressure causes heart disease and increases the risk of stroke. This is more likely to develop in people who have high blood pressure readings or are overweight. Talk with your health care provider about your target blood pressure readings. Have your blood pressure checked: Every 3-5 years if you are 18-39 years of age. Every year if you are 40 years old or older. If you are between the ages of 65 and 75 and are a current or former smoker, ask your health care provider if you should have a one-time screening for abdominal aortic aneurysm (AAA). Diabetes Have regular diabetes screenings. This checks your fasting blood sugar level. Have the screening done: Once every three years after age 45 if you are at a normal weight and have a low risk for diabetes. More often and at a younger age if you are overweight or have a high risk for diabetes. What should I know about preventing infection? Hepatitis B If you have a higher risk for hepatitis B, you should be screened for this virus. Talk with your health care provider to find out if you are at risk for hepatitis B infection. Hepatitis C Blood testing is recommended for: Everyone born from 1945 through 1965. Anyone with known risk factors for hepatitis C. Sexually transmitted infections (STIs) You should be screened each year for STIs, including gonorrhea and chlamydia, if: You are sexually active and are younger than 56 years of age. You are older than 56 years of age and your   health care provider tells you that you are at risk for this type of infection. Your sexual activity has changed since you were last screened, and you are at increased risk for chlamydia or gonorrhea. Ask your health care provider if you are at risk. Ask your health care provider about whether you are at high risk for HIV. Your health care provider  may recommend a prescription medicine to help prevent HIV infection. If you choose to take medicine to prevent HIV, you should first get tested for HIV. You should then be tested every 3 months for as long as you are taking the medicine. Follow these instructions at home: Alcohol use Do not drink alcohol if your health care provider tells you not to drink. If you drink alcohol: Limit how much you have to 0-2 drinks a day. Know how much alcohol is in your drink. In the [place] one drink equals one 12 oz bottle of beer (355 mL), one 5 oz glass of wine (148 mL), or one 1 oz glass of hard liquor (44 mL). Lifestyle Do not use any products that contain nicotine or tobacco. These products include cigarettes, chewing tobacco, and vaping devices, such as e-cigarettes. If you need help quitting, ask your health care provider. Do not use street drugs. Do not share needles. Ask your health care provider for help if you need support or information about quitting drugs. General instructions Schedule regular health, dental, and eye exams. Stay current with your vaccines. Tell your health care provider if: You often feel depressed. You have ever been abused or do not feel safe at home. Summary Adopting a healthy lifestyle and getting preventive care are important in promoting health and wellness. Follow your health care provider's instructions about healthy diet, exercising, and getting tested or screened for diseases. Follow your health care provider's instructions on monitoring your cholesterol and blood pressure. This information is not intended to replace advice given to you by your health care provider. Make sure you discuss any questions you have with your health care provider. Document Revised: 04/05/2021 Document Reviewed: 04/05/2021 Elsevier Patient Education  2023 [employer]  

## 2022-06-17 LAB — COMPREHENSIVE METABOLIC PANEL
ALT: 22 IU/L (ref 0–44)
AST: 17 IU/L (ref 0–40)
Albumin/Globulin Ratio: 1.4 (ref 1.2–2.2)
Albumin: 4.1 g/dL (ref 3.8–4.9)
Alkaline Phosphatase: 80 IU/L (ref 44–121)
BUN/Creatinine Ratio: 15 (ref 9–20)
BUN: 16 mg/dL (ref 6–24)
Bilirubin Total: 1.3 mg/dL — ABNORMAL HIGH (ref 0.0–1.2)
CO2: 28 mmol/L (ref 20–29)
Calcium: 9.3 mg/dL (ref 8.7–10.2)
Chloride: 97 mmol/L (ref 96–106)
Creatinine, Ser: 1.07 mg/dL (ref 0.76–1.27)
Globulin, Total: 3 g/dL (ref 1.5–4.5)
Glucose: 144 mg/dL — ABNORMAL HIGH (ref 70–99)
Potassium: 3.3 mmol/L — ABNORMAL LOW (ref 3.5–5.2)
Sodium: 140 mmol/L (ref 134–144)
Total Protein: 7.1 g/dL (ref 6.0–8.5)
eGFR: 81 mL/min/{1.73_m2} (ref >59)

## 2022-06-17 LAB — CBC WITH DIFFERENTIAL/PLATELET
Basophils Absolute: 0 10*3/uL (ref 0.0–0.2)
Basos: 0 %
EOS (ABSOLUTE): 0.2 10*3/uL (ref 0.0–0.4)
Eos: 3 %
Hematocrit: 40.7 % (ref 37.5–51.0)
Hemoglobin: 14 g/dL (ref 13.0–17.7)
Immature Grans (Abs): 0 10*3/uL (ref 0.0–0.1)
Immature Granulocytes: 0 %
Lymphocytes Absolute: 1.4 10*3/uL (ref 0.7–3.1)
Lymphs: 17 %
MCH: 30.2 pg (ref 26.6–33.0)
MCHC: 34.4 g/dL (ref 31.5–35.7)
MCV: 88 fL (ref 79–97)
Monocytes Absolute: 0.7 10*3/uL (ref 0.1–0.9)
Monocytes: 8 %
Neutrophils Absolute: 5.6 10*3/uL (ref 1.4–7.0)
Neutrophils: 72 %
Platelets: 262 10*3/uL (ref 150–450)
RBC: 4.64 x10E6/uL (ref 4.14–5.80)
RDW: 14.5 % (ref 11.6–15.4)
WBC: 8 10*3/uL (ref 3.4–10.8)

## 2022-06-17 LAB — LIPID PANEL
Chol/HDL Ratio: 5.1 ratio — ABNORMAL HIGH (ref 0.0–5.0)
Cholesterol, Total: 210 mg/dL — ABNORMAL HIGH (ref 100–199)
HDL: 41 mg/dL (ref >39)
LDL Chol Calc (NIH): 145 mg/dL — ABNORMAL HIGH (ref 0–99)
Triglycerides: 131 mg/dL (ref 0–149)
VLDL Cholesterol Cal: 24 mg/dL (ref 5–40)

## 2022-06-21 NOTE — Progress Notes (Signed)
Lvm for pt to call back for lab results. [facility]

## 2022-06-23 NOTE — Progress Notes (Signed)
Lvm for pt to call back to advised if he is taking his lipitor. [place]

## 2022-09-30 ENCOUNTER — Ambulatory Visit (HOSPITAL_BASED_OUTPATIENT_CLINIC_OR_DEPARTMENT_OTHER): Payer: 59 | Attending: Family Medicine | Admitting: Internal Medicine

## 2022-09-30 DIAGNOSIS — G4720 Circadian rhythm sleep disorder, unspecified type: Secondary | ICD-10-CM

## 2022-12-22 ENCOUNTER — Ambulatory Visit: Payer: 59 | Admitting: Family Medicine

## 2023-06-23 ENCOUNTER — Other Ambulatory Visit: Payer: Self-pay | Admitting: Family Medicine

## 2023-06-23 DIAGNOSIS — I4891 Unspecified atrial fibrillation: Secondary | ICD-10-CM

## 2023-07-20 ENCOUNTER — Other Ambulatory Visit: Payer: Self-pay | Admitting: Family Medicine

## 2023-07-20 DIAGNOSIS — I152 Hypertension secondary to endocrine disorders: Secondary | ICD-10-CM

## 2023-08-11 ENCOUNTER — Inpatient Hospital Stay (HOSPITAL_COMMUNITY)
Admission: EM | Admit: 2023-08-11 | Discharge: 2023-08-13 | DRG: 062 | Disposition: A | Discharge status: Home / Self Care | Payer: 59 | Attending: Student in an Organized Health Care Education/Training Program | Admitting: Student in an Organized Health Care Education/Training Program

## 2023-08-11 ENCOUNTER — Emergency Department (HOSPITAL_COMMUNITY): Payer: 59

## 2023-08-11 ENCOUNTER — Ambulatory Visit: Payer: 59 | Admitting: Medical

## 2023-08-11 DIAGNOSIS — E876 Hypokalemia: Secondary | ICD-10-CM | POA: Diagnosis present

## 2023-08-11 DIAGNOSIS — Z87891 Personal history of nicotine dependence: Secondary | ICD-10-CM | POA: Diagnosis not present

## 2023-08-11 DIAGNOSIS — Z7901 Long term (current) use of anticoagulants: Secondary | ICD-10-CM | POA: Diagnosis not present

## 2023-08-11 DIAGNOSIS — I482 Chronic atrial fibrillation, unspecified: Secondary | ICD-10-CM | POA: Diagnosis present

## 2023-08-11 DIAGNOSIS — I959 Hypotension, unspecified: Secondary | ICD-10-CM | POA: Diagnosis present

## 2023-08-11 DIAGNOSIS — I6381 Other cerebral infarction due to occlusion or stenosis of small artery: Secondary | ICD-10-CM | POA: Diagnosis present

## 2023-08-11 DIAGNOSIS — Z880 Allergy status to penicillin: Secondary | ICD-10-CM

## 2023-08-11 DIAGNOSIS — E1165 Type 2 diabetes mellitus with hyperglycemia: Secondary | ICD-10-CM | POA: Diagnosis present

## 2023-08-11 DIAGNOSIS — I6389 Other cerebral infarction: Secondary | ICD-10-CM | POA: Diagnosis not present

## 2023-08-11 DIAGNOSIS — E785 Hyperlipidemia, unspecified: Secondary | ICD-10-CM | POA: Diagnosis present

## 2023-08-11 DIAGNOSIS — R29810 Facial weakness: Secondary | ICD-10-CM | POA: Diagnosis present

## 2023-08-11 DIAGNOSIS — Z6841 Body Mass Index (BMI) 40.0 and over, adult: Secondary | ICD-10-CM | POA: Diagnosis not present

## 2023-08-11 DIAGNOSIS — Z8249 Family history of ischemic heart disease and other diseases of the circulatory system: Secondary | ICD-10-CM | POA: Diagnosis not present

## 2023-08-11 DIAGNOSIS — I160 Hypertensive urgency: Secondary | ICD-10-CM | POA: Diagnosis present

## 2023-08-11 DIAGNOSIS — Z79899 Other long term (current) drug therapy: Secondary | ICD-10-CM | POA: Diagnosis not present

## 2023-08-11 DIAGNOSIS — I10 Essential (primary) hypertension: Secondary | ICD-10-CM | POA: Diagnosis present

## 2023-08-11 DIAGNOSIS — E1121 Type 2 diabetes mellitus with diabetic nephropathy: Secondary | ICD-10-CM

## 2023-08-11 DIAGNOSIS — I639 Cerebral infarction, unspecified: Principal | ICD-10-CM | POA: Diagnosis present

## 2023-08-11 DIAGNOSIS — R29700 NIHSS score 0: Secondary | ICD-10-CM | POA: Diagnosis present

## 2023-08-11 DIAGNOSIS — Z91128 Patient's intentional underdosing of medication regimen for other reason: Secondary | ICD-10-CM

## 2023-08-11 DIAGNOSIS — T45516A Underdosing of anticoagulants, initial encounter: Secondary | ICD-10-CM | POA: Diagnosis present

## 2023-08-11 LAB — DIFFERENTIAL
Abs Immature Granulocytes: 0.03 10*3/uL (ref 0.00–0.07)
Basophils Absolute: 0.1 10*3/uL (ref 0.0–0.1)
Basophils Relative: 1 %
Eosinophils Absolute: 0.2 10*3/uL (ref 0.0–0.5)
Eosinophils Relative: 3 %
Immature Granulocytes: 0 %
Lymphocytes Relative: 14 %
Lymphs Abs: 1.2 10*3/uL (ref 0.7–4.0)
Monocytes Absolute: 0.5 10*3/uL (ref 0.1–1.0)
Monocytes Relative: 7 %
Neutro Abs: 6.1 10*3/uL (ref 1.7–7.7)
Neutrophils Relative %: 75 %

## 2023-08-11 LAB — COMPREHENSIVE METABOLIC PANEL
ALT: 19 U/L (ref 0–44)
AST: 20 U/L (ref 15–41)
Albumin: 3.6 g/dL (ref 3.5–5.0)
Alkaline Phosphatase: 66 U/L (ref 38–126)
Anion gap: 11 (ref 5–15)
BUN: 12 mg/dL (ref 6–20)
CO2: 27 mmol/L (ref 22–32)
Calcium: 9.2 mg/dL (ref 8.9–10.3)
Chloride: 98 mmol/L (ref 98–111)
Creatinine, Ser: 0.94 mg/dL (ref 0.61–1.24)
GFR, Estimated: >60 mL/min (ref >60)
Glucose, Bld: 150 mg/dL — ABNORMAL HIGH (ref 70–99)
Potassium: 3 mmol/L — ABNORMAL LOW (ref 3.5–5.1)
Sodium: 136 mmol/L (ref 135–145)
Total Bilirubin: 1.2 mg/dL (ref 0.3–1.2)
Total Protein: 7.2 g/dL (ref 6.5–8.1)

## 2023-08-11 LAB — I-STAT CHEM 8, ED
BUN: 14 mg/dL (ref 6–20)
Calcium, Ion: 1.15 mmol/L (ref 1.15–1.40)
Chloride: 98 mmol/L (ref 98–111)
Creatinine, Ser: 0.9 mg/dL (ref 0.61–1.24)
Glucose, Bld: 150 mg/dL — ABNORMAL HIGH (ref 70–99)
HCT: 45 % (ref 39.0–52.0)
Hemoglobin: 15.3 g/dL (ref 13.0–17.0)
Potassium: 3.1 mmol/L — ABNORMAL LOW (ref 3.5–5.1)
Sodium: 138 mmol/L (ref 135–145)
TCO2: 27 mmol/L (ref 22–32)

## 2023-08-11 LAB — GLUCOSE, CAPILLARY: Glucose-Capillary: 134 mg/dL — ABNORMAL HIGH (ref 70–99)

## 2023-08-11 LAB — CBC
HCT: 43.9 % (ref 39.0–52.0)
Hemoglobin: 14.8 g/dL (ref 13.0–17.0)
MCH: 29.3 pg (ref 26.0–34.0)
MCHC: 33.7 g/dL (ref 30.0–36.0)
MCV: 86.9 fL (ref 80.0–100.0)
Platelets: 274 10*3/uL (ref 150–400)
RBC: 5.05 MIL/uL (ref 4.22–5.81)
RDW: 13.5 % (ref 11.5–15.5)
WBC: 8.1 10*3/uL (ref 4.0–10.5)
nRBC: 0 % (ref 0.0–0.2)

## 2023-08-11 LAB — RAPID URINE DRUG SCREEN, HOSP PERFORMED
Amphetamines: NOT DETECTED
Barbiturates: NOT DETECTED
Benzodiazepines: NOT DETECTED
Cocaine: NOT DETECTED
Opiates: NOT DETECTED
Tetrahydrocannabinol: NOT DETECTED

## 2023-08-11 LAB — ETHANOL: Alcohol, Ethyl (B): <10 mg/dL (ref <10)

## 2023-08-11 LAB — PROTIME-INR
INR: 1.1 (ref 0.8–1.2)
Prothrombin Time: 14.1 s (ref 11.4–15.2)

## 2023-08-11 LAB — HIV ANTIBODY (ROUTINE TESTING W REFLEX): HIV Screen 4th Generation wRfx: NONREACTIVE

## 2023-08-11 LAB — APTT: aPTT: 28 s (ref 24–36)

## 2023-08-11 LAB — CBG MONITORING, ED: Glucose-Capillary: 150 mg/dL — ABNORMAL HIGH (ref 70–99)

## 2023-08-11 MED ORDER — STROKE: EARLY STAGES OF RECOVERY BOOK
Freq: Once | Status: AC
  Filled 2023-08-11: qty 1

## 2023-08-11 MED ORDER — ACETAMINOPHEN 160 MG/5ML PO SOLN: 650.0000 mg | ORAL | Status: DC | PRN

## 2023-08-11 MED ORDER — POTASSIUM CHLORIDE CRYS ER 20 MEQ PO TBCR
40.0000 meq | EXTENDED_RELEASE_TABLET | Freq: Once | ORAL | Status: AC
  Administered 2023-08-11: 40 meq via ORAL
  Filled 2023-08-11: qty 2

## 2023-08-11 MED ORDER — CHLORHEXIDINE GLUCONATE CLOTH 2 % EX PADS
6.0000 | MEDICATED_PAD | Freq: Every day | CUTANEOUS | Status: DC
  Administered 2023-08-11: 6 via TOPICAL

## 2023-08-11 MED ORDER — CLEVIDIPINE BUTYRATE 0.5 MG/ML IV EMUL
0.0000 mg/h | INTRAVENOUS | Status: DC
  Administered 2023-08-11: 2 mg/h via INTRAVENOUS
  Filled 2023-08-11: qty 100

## 2023-08-11 MED ORDER — PANTOPRAZOLE SODIUM 40 MG IV SOLR: 40.0000 mg | Freq: Every day | INTRAVENOUS | Status: DC

## 2023-08-11 MED ORDER — LABETALOL HCL 5 MG/ML IV SOLN: 20.0000 mg | Freq: Once | INTRAVENOUS | Status: DC

## 2023-08-11 MED ORDER — PANTOPRAZOLE SODIUM 40 MG PO TBEC
40.0000 mg | DELAYED_RELEASE_TABLET | Freq: Every day | ORAL | Status: DC
  Administered 2023-08-11 – 2023-08-12 (×2): 40 mg via ORAL
  Filled 2023-08-11 (×2): qty 1

## 2023-08-11 MED ORDER — SENNOSIDES-DOCUSATE SODIUM 8.6-50 MG PO TABS: 1.0000 | ORAL_TABLET | Freq: Every evening | ORAL | Status: DC | PRN

## 2023-08-11 MED ORDER — SODIUM CHLORIDE 0.9 % IV SOLN: INTRAVENOUS | Status: DC

## 2023-08-11 MED ORDER — IOHEXOL 350 MG/ML SOLN
75.0000 mL | Freq: Once | INTRAVENOUS | Status: AC | PRN
  Administered 2023-08-11: 75 mL via INTRAVENOUS

## 2023-08-11 MED ORDER — TENECTEPLASE FOR STROKE
25.0000 mg | PACK | Freq: Once | INTRAVENOUS | Status: AC
  Administered 2023-08-11: 25 mg via INTRAVENOUS
  Filled 2023-08-11: qty 10

## 2023-08-11 MED ORDER — ACETAMINOPHEN 325 MG PO TABS
650.0000 mg | ORAL_TABLET | ORAL | Status: DC | PRN
  Administered 2023-08-11 – 2023-08-12 (×2): 650 mg via ORAL
  Filled 2023-08-11 (×3): qty 2

## 2023-08-11 MED ORDER — ACETAMINOPHEN 650 MG RE SUPP: 650.0000 mg | RECTAL | Status: DC | PRN

## 2023-08-11 NOTE — ED Provider Notes (Signed)
[place] EMERGENCY DEPARTMENT AT [facility] Provider Note   CSN: [id number] Arrival date & time: 08/11/23  4098     History  Chief Complaint  Patient presents with   Numbness   arm numbness   Code Stroke    Stanley Wilkerson is a 57 y.o. male.  HPI   57 year old male presents the emergency department concern for left-sided facial numbness and left upper extremity numbness.  Patient states that he woke up around 2 AM this morning.  He moved himself to the couch and watch TV until he fell back asleep.  This is normal for him, at that time he had no numbness or tingling.  He states he got off the couch around 5 in the morning, still felt baseline.  He got to work he was okay for couple hours however around 8 AM he developed sudden left-sided facial and left upper extremity numbness.  Workers noticed that he looked like he did not feel well.  There is report of possible slurred speech.  The patient otherwise denies any acute facial droop, vision loss.  He states that his left hand grip did feel weaker than normal.  Denies any acute headache.  Home Medications Prior to Admission medications   Medication Sig Start Date End Date Taking? Authorizing Provider  Accu-Chek Softclix Lancets lancets 1 each by Other route 2 (two) times daily at 8 am and 10 pm. Use as instructed Patient not taking: Reported on 03/21/2022 05/26/20   Ronnald Nian, MD  amiodarone (PACERONE) 200 MG tablet Take 1 tablet (200 mg total) by mouth daily. 11/29/20   Rinaldo Cloud, MD  apixaban (ELIQUIS) 5 MG TABS tablet Take 1 tablet (5 mg total) by mouth 2 (two) times daily. 11/29/20   Rinaldo Cloud, MD  atorvastatin (LIPITOR) 80 MG tablet Take 1 tablet (80 mg total) by mouth daily. 06/16/22   Ronnald Nian, MD  dorzolamide-timolol (COSOPT) 22.3-6.8 MG/ML ophthalmic solution Place 1 drop into both eyes 2 (two) times daily. Patient not taking: Reported on 08/12/2021    [provider]  doxycycline  (VIBRA-TABS) 100 MG tablet Take 1 tablet (100 mg total) by mouth 2 (two) times daily. Patient not taking: Reported on 06/16/2022 03/21/22   Ronnald Nian, MD  glucose blood test strip 1 each by Other route as needed for other. Use as instructed to check blood sugar daily Patient not taking: Reported on 03/21/2022 05/26/20   Ronnald Nian, MD  losartan-hydrochlorothiazide [facility]) 100-12.5 MG tablet Take 1 tablet by mouth once daily 07/20/23   Ronnald Nian, MD  metoprolol succinate (TOPROL-XL) 50 MG 24 hr tablet TAKE 1 TABLET BY MOUTH ONCE DAILY. TAKE WITH OR IMMEDIATELY FOLLOWING A MEAL 06/23/23   Ronnald Nian, MD  nitroGLYCERIN (NITROSTAT) 0.4 MG SL tablet Place 1 tablet (0.4 mg total) under the tongue every 5 (five) minutes x 3 doses as needed for chest pain. Patient not taking: Reported on 06/16/2022 11/29/20   Rinaldo Cloud, MD  ondansetron (ZOFRAN ODT) 8 MG disintegrating tablet Take 1 tablet (8 mg total) by mouth every 8 (eight) hours as needed for nausea or vomiting. Patient not taking: Reported on 05/10/2021 02/07/21   Linwood Dibbles, MD  potassium chloride SA (KLOR-CON) 20 MEQ tablet Take 1 tablet (20 mEq total) by mouth 2 (two) times daily for 5 days. Patient not taking: Reported on 06/22/2021 02/07/21 05/10/21  Linwood Dibbles, MD  sildenafil (REVATIO) 20 MG tablet See admin instructions. Patient not taking:  Reported on 08/12/2021 04/12/21   [provider]  sildenafil (VIAGRA) 100 MG tablet Take 1 tablet (100 mg total) by mouth daily as needed for erectile dysfunction. 06/16/22   Ronnald Nian, MD  spironolactone (ALDACTONE) 25 MG tablet Take 25 mg by mouth daily. 07/26/21   [provider]      Allergies    Penicillins    Review of Systems   Review of Systems  Constitutional:  Negative for fever.  Respiratory:  Negative for shortness of breath.   Cardiovascular:  Negative for chest pain.  Gastrointestinal:  Negative for abdominal pain, diarrhea and vomiting.  Skin:   Negative for rash.  Neurological:  Positive for speech difficulty, weakness and numbness. Negative for headaches.    Physical Exam Updated Vital Signs BP (!) 184/128   Pulse (!) 53   Temp 98.1 F (36.7 C) (Oral)   Resp 18   Wt 123.6 kg   SpO2 99%   BMI 43.00 kg/m  Physical Exam Vitals and nursing note reviewed.  Constitutional:      General: He is not in acute distress.    Appearance: Normal appearance.  HENT:     Head: Normocephalic.     Mouth/Throat:     Mouth: Mucous membranes are moist.  Eyes:     Extraocular Movements: Extraocular movements intact.     Pupils: Pupils are equal, round, and reactive to light.  Cardiovascular:     Rate and Rhythm: Normal rate.  Pulmonary:     Effort: Pulmonary effort is normal. No respiratory distress.  Abdominal:     Palpations: Abdomen is soft.     Tenderness: There is no abdominal tenderness.  Skin:    General: Skin is warm.  Neurological:     Mental Status: He is alert and oriented to person, place, and time.     Comments: Patient states the sensory of the left face and left arm feels equal to the right on exam however continues to complain that those areas feel like they are "asleep", he states that his left hand grip feels weak but I do not appreciate any significant deficit.  Psychiatric:        Mood and Affect: Mood normal.     ED Results / Procedures / Treatments   Labs (all labs ordered are listed, but only abnormal results are displayed) Labs Reviewed  CBG MONITORING, ED - Abnormal; Notable for the following components:      Result Value   Glucose-Capillary 150 (*)    All other components within normal limits  I-STAT CHEM 8, ED - Abnormal; Notable for the following components:   Potassium 3.1 (*)    Glucose, Bld 150 (*)    All other components within normal limits  CBC  DIFFERENTIAL  ETHANOL  PROTIME-INR  APTT  COMPREHENSIVE METABOLIC PANEL  RAPID URINE DRUG SCREEN, HOSP PERFORMED    EKG EKG  Interpretation Date/Time:  Friday August 11 2023 09:47:55 EDT Ventricular Rate:  105 PR Interval:    QRS Duration:  96 QT Interval:  396 QTC Calculation: 523 R Axis:   77  Text Interpretation: Atrial fibrillation with rapid ventricular response with premature ventricular or aberrantly conducted complexes Nonspecific T wave abnormality Prolonged QT Abnormal ECG When compared with ECG of 07-Feb-2021 03:43, PREVIOUS ECG IS PRESENT Confirmed by Coralee Pesa ([id number]) on 08/11/2023 10:09:08 AM  Radiology No results found.  Procedures .Critical Care  Performed by: Rozelle Logan, DO Authorized by: Rozelle Logan, DO  Critical care provider statement:    Critical care time (minutes):  45   Critical care time was exclusive of:  Separately billable procedures and treating other patients   Critical care was necessary to treat or prevent imminent or life-threatening deterioration of the following conditions:  CNS failure or compromise   Critical care was time spent personally by me on the following activities:  Development of treatment plan with patient or surrogate, discussions with consultants, evaluation of patient's response to treatment, examination of patient, ordering and review of laboratory studies, ordering and review of radiographic studies, ordering and performing treatments and interventions, pulse oximetry, re-evaluation of patient's condition and review of old charts     Medications Ordered in ED Medications  tenecteplase (TNKASE) injection for Stroke 25 mg (has no administration in time range)    ED Course/ Medical Decision Making/ A&P                                 Medical Decision Making Amount and/or Complexity of Data Reviewed Labs: ordered. Radiology: ordered.   57 year old male presents emergency department with sudden onset left-sided face and left upper extremity numbness.  Possible slurred speech and weakened grip strength prior to arrival.  Patient  was evaluated in triage, I placed a code stroke given that symptoms started approximately 2 hours prior to arrival, last known normal was 8 AM.  Stroke neurology team paged, they evaluated the patient in the CT.  Patient has history of A-fib, supposed to be anticoagulant on Eliquis but has not been taking his medication.  For this reason he was given TN K for assumed sensory CVA.  Dr. Wilford Corner at bedside.  On reevaluation symptoms are not significantly changed, blood pressures improving.  Patient will be admitted to the neurology team to a critical care bed for further evaluation and treatment.  Patients evaluation and results requires admission for further treatment and care.  Spoke with hospitalist, reviewed patient's ED course and they accept admission.  Patient agrees with admission plan, offers no new complaints and is stable/unchanged at time of admit.        Final Clinical Impression(s) / ED Diagnoses Final diagnoses:  None    Rx / DC Orders ED Discharge Orders     None         Rozelle Logan, DO 08/11/23 1042

## 2023-08-11 NOTE — H&P (Signed)
Stroke Neurology Admission History & Physical   CC: Left-sided numbness  History is obtained from: Patient, chart  HPI: Stanley Wilkerson is a 57 y.o. male past medical history of obesity, hypertension,, diabetes, atrial fibrillation not on anticoagulation presented to the emergency department for evaluation of sudden onset of left face and arm numbness.  Reports he woke up normally this morning but was feeling a little tired but it was not until right around 8 AM when he had a sudden onset of numbness in the left face and left arm below his elbow up into the left hand.  He came to the emergency department, was seen in the ED and a code stroke was activated.  On my evaluation, his NIH stroke scale was 0.  He continued to complain of subjective left face and arm sensation of heaviness and numbness although touch sensation was normal. I had a detailed discussion of risks, benefits and alternatives of IV TNKase-he agreed to proceed. He also was hypotensive and required a dose of labetalol 10 mg IV x 1 to get his pressures to the point where he could receive IV TNKase.  Also there was delay in getting IV access.   LKW: 8 AM IV thrombolysis given?:  Yes Premorbid modified Rankin scale (mRS): 0  ROS: Full ROS was performed and is negative except as noted in the HPI.  Past Medical History:  Diagnosis Date   Hypertension    Obesity      Family History  Problem Relation Age of Onset   Heart disease Father      Social History:   reports that he has quit smoking. He has never used smokeless tobacco. He reports that he does not drink alcohol and does not use drugs.  Medications No current facility-administered medications for this encounter.  Current Outpatient Medications:    Accu-Chek Softclix Lancets lancets, 1 each by Other route 2 (two) times daily at 8 am and 10 pm. Use as instructed (Patient not taking: Reported on 03/21/2022), Disp: 100 each, Rfl: 3   amiodarone (PACERONE) 200  MG tablet, Take 1 tablet (200 mg total) by mouth daily., Disp: 30 tablet, Rfl: 3   apixaban (ELIQUIS) 5 MG TABS tablet, Take 1 tablet (5 mg total) by mouth 2 (two) times daily., Disp: 60 tablet, Rfl: 3   atorvastatin (LIPITOR) 80 MG tablet, Take 1 tablet (80 mg total) by mouth daily., Disp: 90 tablet, Rfl: 3   dorzolamide-timolol (COSOPT) 22.3-6.8 MG/ML ophthalmic solution, Place 1 drop into both eyes 2 (two) times daily. (Patient not taking: Reported on 08/12/2021), Disp: , Rfl:    doxycycline (VIBRA-TABS) 100 MG tablet, Take 1 tablet (100 mg total) by mouth 2 (two) times daily. (Patient not taking: Reported on 06/16/2022), Disp: 28 tablet, Rfl: 0   glucose blood test strip, 1 each by Other route as needed for other. Use as instructed to check blood sugar daily (Patient not taking: Reported on 03/21/2022), Disp: 100 each, Rfl: 3   losartan-hydrochlorothiazide (HYZAAR) 100-12.5 MG tablet, Take 1 tablet by mouth once daily, Disp: 30 tablet, Rfl: 0   metoprolol succinate (TOPROL-XL) 50 MG 24 hr tablet, TAKE 1 TABLET BY MOUTH ONCE DAILY. TAKE WITH OR IMMEDIATELY FOLLOWING A MEAL, Disp: 30 tablet, Rfl: 0   nitroGLYCERIN (NITROSTAT) 0.4 MG SL tablet, Place 1 tablet (0.4 mg total) under the tongue every 5 (five) minutes x 3 doses as needed for chest pain. (Patient not taking: Reported on 06/16/2022), Disp: 25 tablet, Rfl: 12  ondansetron (ZOFRAN ODT) 8 MG disintegrating tablet, Take 1 tablet (8 mg total) by mouth every 8 (eight) hours as needed for nausea or vomiting. (Patient not taking: Reported on 05/10/2021), Disp: 20 tablet, Rfl: 0   potassium chloride SA (KLOR-CON) 20 MEQ tablet, Take 1 tablet (20 mEq total) by mouth 2 (two) times daily for 5 days. (Patient not taking: Reported on 06/22/2021), Disp: 10 tablet, Rfl: 0   sildenafil (REVATIO) 20 MG tablet, See admin instructions. (Patient not taking: Reported on 08/12/2021), Disp: , Rfl:    sildenafil (VIAGRA) 100 MG tablet, Take 1 tablet (100 mg total) by mouth  daily as needed for erectile dysfunction., Disp: 10 tablet, Rfl: 11   spironolactone (ALDACTONE) 25 MG tablet, Take 25 mg by mouth daily., Disp: , Rfl:   Has Eliquis listed on the medication list but is not compliant with it and has not taken it for multiple days.  Pharmacist also confirmed with the fill history that it has not been filled for a while.  Exam: Current vital signs: BP (!) 184/128   Pulse (!) 53   Temp 98.1 F (36.7 C) (Oral)   Resp 18   SpO2 99%  Vital signs in last 24 hours: Temp:  [98.1 F (36.7 C)] 98.1 F (36.7 C) (09/13 0943) Pulse Rate:  [53] 53 (09/13 0943) Resp:  [18] 18 (09/13 0943) BP: (184)/(128) 184/128 (09/13 0943) SpO2:  [99 %] 99 % (09/13 0943) General: Awake alert in no distress HEENT: Normocephalic/atraumatic Lungs: Clear Cardiovascular: Regular rhythm Abdomen nondistended nontender Neurological exam Awake alert oriented x 3.  No dysarthria.  No aphasia. Cranial nerves II to XII intact Motor examination with no drift Sensation intact to light touch but reports that left arm and leg as well as left face feels different in comparison to the right. Coordination examination with no dysmetria NIH stroke scale-0  Labs I have reviewed labs in epic and the results pertinent to this consultation are:  CBC    Component Value Date/Time   WBC 8.0 06/16/2022 0927   WBC 9.6 02/07/2021 0349   RBC 4.64 06/16/2022 0927   RBC 5.02 02/07/2021 0349   HGB 14.0 06/16/2022 0927   HCT 40.7 06/16/2022 0927   PLT 262 06/16/2022 0927   MCV 88 06/16/2022 0927   MCH 30.2 06/16/2022 0927   MCH 29.7 02/07/2021 0349   MCHC 34.4 06/16/2022 0927   MCHC 33.8 02/07/2021 0349   RDW 14.5 06/16/2022 0927   LYMPHSABS 1.4 06/16/2022 0927   MONOABS 0.9 02/07/2021 0349   EOSABS 0.2 06/16/2022 0927   BASOSABS 0.0 06/16/2022 0927    CMP     Component Value Date/Time   NA 140 06/16/2022 0927   K 3.3 (L) 06/16/2022 0927   CL 97 06/16/2022 0927   CO2 28 06/16/2022  0927   GLUCOSE 144 (H) 06/16/2022 0927   GLUCOSE 211 (H) 02/07/2021 0349   BUN 16 06/16/2022 0927   CREATININE 1.07 06/16/2022 0927   CREATININE 0.99 08/26/2014 1501   CALCIUM 9.3 06/16/2022 0927   PROT 7.1 06/16/2022 0927   ALBUMIN 4.1 06/16/2022 0927   AST 17 06/16/2022 0927   ALT 22 06/16/2022 0927   ALKPHOS 80 06/16/2022 0927   BILITOT 1.3 (H) 06/16/2022 0927   GFRNONAA >60 02/07/2021 0349   GFRAA 111 05/06/2020 1000    Lipid Panel     Component Value Date/Time   CHOL 210 (H) 06/16/2022 0927   TRIG 131 06/16/2022 0927   HDL 41 06/16/2022  1610   CHOLHDL 5.1 (H) 06/16/2022 0927   CHOLHDL 4.7 11/29/2020 0452   VLDL 20 11/29/2020 0452   LDLCALC 145 (H) 06/16/2022 0927     Imaging I have reviewed the images obtained:  CT-head-aspects 10.  No bleed.  CTA head and neck done after TNK administration-no evidence of emergent large vessel occlusion  Assessment:  57 year old with past medical history of diabetes, hypertension, hyperlipidemia, atrial fibrillation not on anticoagulation with sudden onset of left face and arm numbness that started acutely at 8 AM.  Last known well within 4 and half hours of symptom onset at the time of this examination.  NIH stroke scale was 0 but he still continued to feel subjectively different on the left face and arm. Risk-benefit and alternatives of IV TNKase were discussed with the concern that this might be a pure sensory lacunar infarct versus an embolic infarct with sensory symptoms only.  He agreed to proceed with IV TNKase.  Plan: Acute Ischemic Stroke Acuity: Acute Current Suspected Etiology: Continue Evaluation:  -Admit to:  -Hold Aspirin until 24 hour post IV thrombolysis (tPA or TNKase) neuroimaging is stable and without evidence of bleeding -Blood pressure control, goal of SYS <180 -MRI/ECHO/A1C/Lipid panel. -Hyperglycemia management per SSI to maintain glucose 140-180mg /dL. -PT/OT/ST therapies and recommendations when  able  CNS Close neuro monitor MRI brain in 24 hours.  RESP No active issues Monitor clinically  CV Hypertensive urgency-required multiple doses of labetalol and now requiring Cleviprex for keeping under the systolic blood pressure goal as above. 2D echo Hyperlipidemia unspecified-statin for goal less than 70. History of atrial fibrillation-not on anticoagulation.  Will need anticoagulation as an outpatient-timing based on stroke size.   HEME No active issues Monitor CBC in the morning  ENDO Documented history of diabetes Check A1c Goal A1c less than 7  GI/GU No active issues Gentle hydration-normal saline 75 cc an hour  Fluid/Electrolyte Disorders Hypokalemia Replete per ICU protocol Check BMP in the morning  ID No active issues   Prophylaxis DVT: SCD GI: PPI Bowel: Doc. Senna  Diet: NPO until cleared by speech  Code Status: Full Code full code   THE FOLLOWING WERE PRESENT ON ADMISSION: Acute ischemic stroke, hypertensive urgency, diabetes, atrial fibrillation not on anticoagulation  Risks, benefits, alternatives of IV thrombolysis were discussed and family/patient agreed to proceed. CT imaging was reviewed personally prior to IV thrombolysis administration with no evidence of bleed. -- Milon Dikes, MD Neurologist [facility] Pager: [phone]   CRITICAL CARE ATTESTATION Performed by: Milon Dikes, MD Total critical care time: 55 minutes Critical care time was exclusive of separately billable procedures and treating other patients and/or supervising APPs/Residents/Students Critical care was necessary to treat or prevent imminent or life-threatening deterioration. This patient is critically ill and at significant risk for neurological worsening and/or death and care requires constant monitoring. Critical care was time spent personally by me on the following activities: development of treatment plan with patient and/or surrogate as well  as nursing, discussions with consultants, evaluation of patient's response to treatment, examination of patient, obtaining history from patient or surrogate, ordering and performing treatments and interventions, ordering and review of laboratory studies, ordering and review of radiographic studies, pulse oximetry, re-evaluation of patient's condition, participation in multidisciplinary rounds and medical decision making of high complexity in the care of this patient.

## 2023-08-11 NOTE — Progress Notes (Signed)
PHARMACIST CODE STROKE RESPONSE  Notified to mix TNK at 1012 by Dr. Jerrell Belfast TNK preparation completed at 1014  TNK dose = 25 mg IV over 5 seconds  Issues/delays encountered (if applicable): came from triage, BP elevated and no IV access in scanner  Stanley Wilkerson [name] 08/11/23 10:05 AM

## 2023-08-11 NOTE — ED Triage Notes (Signed)
Pt. Stated, I noticed around 0800 I had some facial numbness on the left side of my face and left arm numbness, and my left hand feels numb and tingling like its asleep. No arm drift, = grips but feels different to me. At work said I had a little slurred speech. My BP was high at my work.

## 2023-08-11 NOTE — Progress Notes (Signed)
PT Cancellation Note  Patient Details Name: Stanley Wilkerson MRN: [id number] DOB: 11-Sep-1966   Cancelled Treatment:    Reason Eval/Treat Not Completed: Active bedrest order (Pt on bedrest after receiving TNK at 1015 for CVA.  Will return tomorrow for PT evaluation.)   Bevelyn Buckles 08/11/2023, 11:08 AM Shaila Gilchrest M,PT Acute Rehab Services [phone]

## 2023-08-11 NOTE — Code Documentation (Signed)
Stroke Response Nurse Documentation Code Documentation  Stanley Wilkerson is a 57 y.o. male arriving to [facility]  via [employer] on 08/11/2023 with past medical hx of obesity, HTN, DM, Afib. On No antithrombotic. Code stroke was activated by ED.   Patient from work where he was LKW at 0800 and now complaining of left face and arm numbness. Patient woke up and around 0800 had a sudden numbness in his left face and left arm. Patient was brought in by his boss.  Stroke team at the bedside on patient arrival. Labs drawn and patient cleared for CT by EDP. Patient to CT with team. NIHSS 0, see documentation for details and code stroke times. The following imaging was completed:  CT Head and CTA. Patient is a candidate for IV Thrombolytic per MD. Patient is not a candidate for IR due to no LVO noted on imaging.   Care Plan: VS/NIHSS q16m x2hr, q67m x6hr, then q1hr; BP Goal: <180/105.   Bedside handoff with ED RN Marchelle Folks.    Felecia Jan  Stroke Response RN

## 2023-08-11 NOTE — Progress Notes (Signed)
OT Cancellation Note  Patient Details Name: Stanley Wilkerson MRN: [id number] DOB: 07-Apr-1966   Cancelled Treatment:    Reason Eval/Treat Not Completed: Medical issues which prohibited therapy. Active bedrest order after receiving TNK at 1015 for CVA. Will continue efforts as appropriate.    Bell Carbo D Mettie Roylance 08/11/2023, 11:44 AM 08/11/2023  RP, OTR/L  Acute Rehabilitation Services  Office:  [phone]

## 2023-08-12 ENCOUNTER — Inpatient Hospital Stay (HOSPITAL_COMMUNITY): Payer: 59

## 2023-08-12 DIAGNOSIS — I6389 Other cerebral infarction: Secondary | ICD-10-CM

## 2023-08-12 DIAGNOSIS — I639 Cerebral infarction, unspecified: Secondary | ICD-10-CM | POA: Diagnosis not present

## 2023-08-12 LAB — ECHOCARDIOGRAM COMPLETE
AR max vel: 2.26 cm2
AV Area VTI: 2.51 cm2
AV Area mean vel: 2.25 cm2
AV Mean grad: 3 mmHg
AV Peak grad: 4.8 mmHg
Ao pk vel: 1.1 m/s
Area-P 1/2: 3.63 cm2
Height: 66.732 in
S' Lateral: 3.7 cm
Weight: 4359.82 [oz_av]

## 2023-08-12 LAB — LIPID PANEL
Cholesterol: 208 mg/dL — ABNORMAL HIGH (ref 0–200)
HDL: 41 mg/dL (ref >40)
LDL Cholesterol: 143 mg/dL — ABNORMAL HIGH (ref 0–99)
Total CHOL/HDL Ratio: 5.1 ratio
Triglycerides: 122 mg/dL (ref <150)
VLDL: 24 mg/dL (ref 0–40)

## 2023-08-12 LAB — BASIC METABOLIC PANEL
Anion gap: 13 (ref 5–15)
BUN: 13 mg/dL (ref 6–20)
CO2: 27 mmol/L (ref 22–32)
Calcium: 8.8 mg/dL — ABNORMAL LOW (ref 8.9–10.3)
Chloride: 97 mmol/L — ABNORMAL LOW (ref 98–111)
Creatinine, Ser: 1.05 mg/dL (ref 0.61–1.24)
GFR, Estimated: >60 mL/min (ref >60)
Glucose, Bld: 151 mg/dL — ABNORMAL HIGH (ref 70–99)
Potassium: 2.7 mmol/L — CL (ref 3.5–5.1)
Sodium: 137 mmol/L (ref 135–145)

## 2023-08-12 LAB — CBC
HCT: 44.4 % (ref 39.0–52.0)
Hemoglobin: 15.3 g/dL (ref 13.0–17.0)
MCH: 29.6 pg (ref 26.0–34.0)
MCHC: 34.5 g/dL (ref 30.0–36.0)
MCV: 85.9 fL (ref 80.0–100.0)
Platelets: 263 10*3/uL (ref 150–400)
RBC: 5.17 MIL/uL (ref 4.22–5.81)
RDW: 13.6 % (ref 11.5–15.5)
WBC: 5.9 10*3/uL (ref 4.0–10.5)
nRBC: 0 % (ref 0.0–0.2)

## 2023-08-12 LAB — HEMOGLOBIN A1C
Hgb A1c MFr Bld: 7.5 % — ABNORMAL HIGH (ref 4.8–5.6)
Mean Plasma Glucose: 169 mg/dL

## 2023-08-12 LAB — MRSA NEXT GEN BY PCR, NASAL: MRSA by PCR Next Gen: NOT DETECTED

## 2023-08-12 LAB — GLUCOSE, CAPILLARY: Glucose-Capillary: 151 mg/dL — ABNORMAL HIGH (ref 70–99)

## 2023-08-12 MED ORDER — APIXABAN 5 MG PO TABS
5.0000 mg | ORAL_TABLET | Freq: Two times a day (BID) | ORAL | Status: DC
  Administered 2023-08-12 – 2023-08-13 (×3): 5 mg via ORAL
  Filled 2023-08-12 (×3): qty 1

## 2023-08-12 MED ORDER — METOPROLOL SUCCINATE ER 50 MG PO TB24
50.0000 mg | ORAL_TABLET | Freq: Every day | ORAL | Status: DC
  Administered 2023-08-12 – 2023-08-13 (×2): 50 mg via ORAL
  Filled 2023-08-12 (×2): qty 1

## 2023-08-12 MED ORDER — INSULIN ASPART 100 UNIT/ML IJ SOLN: 0.0000 [IU] | Freq: Every day | INTRAMUSCULAR | Status: DC

## 2023-08-12 MED ORDER — LABETALOL HCL 5 MG/ML IV SOLN: 10.0000 mg | INTRAVENOUS | Status: DC | PRN

## 2023-08-12 MED ORDER — ROSUVASTATIN CALCIUM 20 MG PO TABS
40.0000 mg | ORAL_TABLET | Freq: Every day | ORAL | Status: DC
  Administered 2023-08-12 – 2023-08-13 (×2): 40 mg via ORAL
  Filled 2023-08-12 (×2): qty 2

## 2023-08-12 MED ORDER — LOSARTAN POTASSIUM 50 MG PO TABS
100.0000 mg | ORAL_TABLET | Freq: Every day | ORAL | Status: DC
  Administered 2023-08-12 – 2023-08-13 (×2): 100 mg via ORAL
  Filled 2023-08-12 (×2): qty 2

## 2023-08-12 MED ORDER — LABETALOL HCL 5 MG/ML IV SOLN
10.0000 mg | Freq: Four times a day (QID) | INTRAVENOUS | Status: DC | PRN
  Administered 2023-08-12: 10 mg via INTRAVENOUS
  Filled 2023-08-12: qty 4

## 2023-08-12 MED ORDER — POTASSIUM CHLORIDE CRYS ER 20 MEQ PO TBCR
40.0000 meq | EXTENDED_RELEASE_TABLET | ORAL | Status: AC
  Administered 2023-08-12 (×2): 40 meq via ORAL
  Filled 2023-08-12 (×2): qty 2

## 2023-08-12 MED ORDER — INSULIN ASPART 100 UNIT/ML IJ SOLN
0.0000 [IU] | Freq: Three times a day (TID) | INTRAMUSCULAR | Status: DC
  Administered 2023-08-13: 3 [IU] via SUBCUTANEOUS
  Administered 2023-08-13: 2 [IU] via SUBCUTANEOUS

## 2023-08-12 NOTE — Progress Notes (Signed)
Occupational Therapy Treatment Patient Details Name: Stanley Wilkerson MRN: [id number] DOB: [birth year] Today's Date: 08/12/2023   History of present illness Patient is 57 y.o. male presented to ED for LT sided facial and Lt UE numbness. He developed numbness at work around 8 AM on 08/11/23 and possible slurred speech. 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 PMH significant for HTN, obesity, and Rt cataract surgery in 2021.   OT comments  Pt participating in pillbox test this session. Pt failing with seven errors with one medication reading "take 3 times daily" and pt set up pill box for 2 times daily. Ultimately needing max cues to correct. Pt and OT having discussion regarding brother assist with medication management when first going home and OT also calling and confirming brother, Stanley Wilkerson, can assist at home. Pt made aware of recommendation for follow up therapy and recommendation for follow up with neuro ophthalmologist.      If plan is discharge home, recommend the following:  A little help with walking and/or transfers;A little help with bathing/dressing/bathroom;Direct supervision/assist for medications management;Help with stairs or ramp for entrance;Assist for transportation;Direct supervision/assist for financial management;Assistance with cooking/housework   Equipment Recommendations  BSC/3in1    Recommendations for Other Services      Precautions / Restrictions Precautions Precautions: Fall Restrictions Weight Bearing Restrictions: No       Mobility Bed Mobility Overal bed mobility: Needs Assistance Bed Mobility: Supine to Sit     Supine to sit: Supervision, HOB elevated, Used rails     General bed mobility comments: use of bed features    Transfers Overall transfer level: Needs assistance Equipment used: None Transfers: Sit to/from Stand Sit to Stand: Contact guard assist           General  transfer comment: guard for safety, pt with wide BOS to stand     Balance Overall balance assessment: Needs assistance Sitting-balance support: Feet supported Sitting balance-Leahy Scale: Good Sitting balance - Comments: able to don socks at EOB   Standing balance support: During functional activity, No upper extremity supported Standing balance-Leahy Scale: Good                             ADL either performed or assessed with clinical judgement   ADL Overall ADL's : Needs assistance/impaired Eating/Feeding: Modified independent   Grooming: Contact guard assist;Standing   Upper Body Bathing: Set up;Sitting   Lower Body Bathing: Contact guard assist;Sit to/from stand   Upper Body Dressing : Set up;Sitting   Lower Body Dressing: Contact guard assist;Sit to/from stand   Toilet Transfer: Contact guard assist;Ambulation             General ADL Comments: focus session on pillbox test    Extremity/Trunk Assessment Upper Extremity Assessment Upper Extremity Assessment: LUE deficits/detail LUE Deficits / Details: Pt reports some numbness in LUE 1st and 2nd digits, strength WFL. Coordination appears equal bilaterally. Unable to oppose thumb to 5th digit on either side.   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 Pt seems unaware of poor vision in L superior quadrant   Perception     Praxis      Cognition Arousal: Alert Behavior During Therapy: WFL for tasks assessed/performed Overall Cognitive Status: Impaired/Different from baseline Area of Impairment: Awareness, Safety/judgement, Memory, Attention                   Current Attention Level: Sustained, Selective Memory: Decreased short-term memory   Safety/Judgement: Decreased awareness of deficits, Decreased awareness of safety Awareness: Emergent   General Comments: Pt seen for second session for pillbox test. Pt making seven errors due to placing pills in pillbox 2 times a day when instructions state 3x per day. Pt needing max cues to understand and correct.        Exercises      Shoulder Instructions       General Comments strongly encouraged to have brother assist with medication management    Pertinent Vitals/ Pain       Pain Assessment Pain Assessment: No/denies pain  Home Living Family/patient expects to be discharged to:: Private residence Living Arrangements: Alone Available Help at Discharge: Family (brother and friends) Type of Home: House Home Access: Stairs to enter Secretary/administrator of Steps: 2   Home Layout: Two level;Able to live on main level with bedroom/bathroom     Bathroom Shower/Tub: Chief Strategy Officer: Standard                Prior Functioning/Environment              Frequency           Progress Toward Goals  OT Goals(current goals can now be found in the care plan section)      Acute Rehab OT Goals Patient Stated Goal: go home OT Goal Formulation: With patient Time For Goal Achievement: 08/26/23 Potential to Achieve Goals: Good  Plan      Co-evaluation                 AM-PAC OT "6 Clicks" Daily Activity     Outcome Measure   Help from another person eating meals?: None Help from another person taking care of personal grooming?: A Little Help from another person toileting, which includes using toliet, bedpan, or urinal?: A Little Help from another person bathing (including washing, rinsing, drying)?: A Little Help from another person to put on and taking off regular upper body clothing?: A Little Help from another person to put on and taking off regular lower body clothing?: A Little 6 Click Score: 19    End of Session        Activity Tolerance     Patient Left     Nurse Communication          Time: 9562-1308 OT Time Calculation (min): 46 min  Charges: OT General Charges $OT Visit: 1 Visit OT Evaluation $OT Eval Moderate Complexity: 1 Mod OT Treatments $Self Care/Home Management : 23-37 mins $Cognitive Funtion inital: Initial 15 mins  Tyler Deis, OTR/L [facility] Acute Rehabilitation Office: [phone]   Myrla Halsted 08/12/2023, 6:21 PM

## 2023-08-12 NOTE — Evaluation (Signed)
Occupational Therapy Evaluation Patient Details Name: Stanley Wilkerson MRN: [id number] DOB: 1966/11/09 Today's Date: 08/12/2023   History of Present Illness Patient is 57 y.o. male presented to ED for LT sided facial and Lt UE numbness. He developed numbness at work around 8 AM on 08/11/23 and possible slurred speech. 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 PMH significant for HTN, obesity, and Rt cataract surgery in 2021.   Clinical Impression   PTA, pt lived alone and was independent in ADL, IADL and working. Upon eval, pt presents with decr vision in his L superior field, decreased memory, problem solving, awareness, and numbness in his L 1st and 2 digits. Pt needing mod cues to recall BE-FAST acronym for S&S of stroke after initial education. Will continue to follow acutely and recommending neuro ophthalmologist follow up, OP OT as well as family to supervise IADL when first returning home; per pt brother can assist.       If plan is discharge home, recommend the following: A little help with walking and/or transfers;A little help with bathing/dressing/bathroom;Direct supervision/assist for medications management;Help with stairs or ramp for entrance;Assist for transportation;Direct supervision/assist for financial management;Assistance with cooking/housework    Functional Status Assessment  Patient has had a recent decline in their functional status and demonstrates the ability to make significant improvements in function in a reasonable and predictable amount of time.  Equipment Recommendations  BSC/3in1    Recommendations for Other Services       Precautions / Restrictions Precautions Precautions: Fall Restrictions Weight Bearing Restrictions: No      Mobility Bed Mobility Overal bed mobility: Needs Assistance Bed Mobility: Supine to Sit     Supine to sit: Supervision, HOB elevated, Used rails      General bed mobility comments: use of bed features    Transfers Overall transfer level: Needs assistance Equipment used: None Transfers: Sit to/from Stand Sit to Stand: Contact guard assist           General transfer comment: guard for safety, pt with wide BOS to stand      Balance Overall balance assessment: Needs assistance Sitting-balance support: Feet supported Sitting balance-Leahy Scale: Good Sitting balance - Comments: able to don socks at EOB   Standing balance support: During functional activity, No upper extremity supported Standing balance-Leahy Scale: Good                             ADL either performed or assessed with clinical judgement   ADL Overall ADL's : Needs assistance/impaired Eating/Feeding: Modified independent   Grooming: Contact guard assist;Standing   Upper Body Bathing: Set up;Sitting   Lower Body Bathing: Contact guard assist;Sit to/from stand   Upper Body Dressing : Set up;Sitting   Lower Body Dressing: Contact guard assist;Sit to/from stand   Toilet Transfer: Contact guard assist;Ambulation                   Vision Baseline Vision/History: 3 Glaucoma (R eye more significant than L per pt) Ability to See in Adequate Light: 1 Impaired Patient Visual Report: Other (comment);No change from baseline (Pt seems unsure; reporting a little blurry (blurry periphery at baseline, and pt unsure if any different from baseline.) Vision Assessment?: Yes Eye Alignment: Within Functional Limits Ocular Range of Motion: Within Functional Limits Alignment/Gaze Preference: Within Defined Limits Tracking/Visual Pursuits: Decreased smoothness of vertical tracking (predominantly on the L) Visual Fields:  Left inferior homonymous quadranopsia Additional Comments: R eye blurry, but this is baseline. Pt seems unaware of poor vision in L superior quadrant     Perception         Praxis         Pertinent Vitals/Pain Pain  Assessment Pain Assessment: No/denies pain     Extremity/Trunk Assessment Upper Extremity Assessment Upper Extremity Assessment: Right hand dominant;Generalized weakness;LUE deficits/detail LUE Deficits / Details: Pt reports some numbness in LUE 1st and 2nd digits, strength WFL. Coordination appears equal bilaterally. Unable to oppose thumb to 5th digit on either side.   Lower Extremity Assessment Lower Extremity Assessment: Defer to PT evaluation RLE Deficits / Details: grossly 4+/5 throughout bil LE's RLE Sensation: WNL RLE Coordination: WNL LLE Deficits / Details: grossly 4+/5 throughout bil LE's LLE Sensation: WNL LLE Coordination: WNL   Cervical / Trunk Assessment Cervical / Trunk Assessment: Kyphotic;Other exceptions Cervical / Trunk Exceptions: habitus   Communication Communication Communication: No apparent difficulties   Cognition Arousal: Alert Behavior During Therapy: WFL for tasks assessed/performed Overall Cognitive Status: Impaired/Different from baseline Area of Impairment: Awareness, Safety/judgement, Memory, Attention                   Current Attention Level: Sustained, Selective (intermittently able to provide sustained attention but distracted by TV with and without volume on) Memory: Decreased short-term memory   Safety/Judgement: Decreased awareness of deficits, Decreased awareness of safety Awareness: Emergent   General Comments: Pt with some awareness that hand feels different than baseline, but no awareness of cognitive difficulty or any changes in vision     General Comments  Reviewed BeFAST: with pt and pt verbalized acronym with cuing, unable to recall meaning of all 4 letters without cues. Educated on lifestyle chagnes that can be made to reduce risk of stroke.    Exercises     Shoulder Instructions      Home Living Family/patient expects to be discharged to:: Private residence Living Arrangements: Alone Available Help at  Discharge: Family (brother and friends) Type of Home: House Home Access: Stairs to enter Secretary/administrator of Steps: 2   Home Layout: Two level;Able to live on main level with bedroom/bathroom     Bathroom Shower/Tub: Chief Strategy Officer: Standard                Prior Functioning/Environment Prior Level of Function : Independent/Modified Independent;Working/employed;Driving             Mobility Comments: no AD ADLs Comments: independent in ADL and IADL, does maintenance        OT Problem List:        OT Treatment/Interventions:      OT Goals(Current goals can be found in the care plan section) Acute Rehab OT Goals Patient Stated Goal: go home OT Goal Formulation: With patient Time For Goal Achievement: 08/26/23 Potential to Achieve Goals: Good  OT Frequency:      Co-evaluation              AM-PAC OT "6 Clicks" Daily Activity     Outcome Measure                 End of Session    Activity Tolerance:   Patient left:                     Time: 1220-1240 OT Time Calculation (min): 20 min Charges:  OT General Charges $OT Visit: 1 Visit OT Evaluation $OT  Eval Moderate Complexity: 1 Mod  Stanley Wilkerson, OTR/L [facility] Acute Rehabilitation Office: [phone]   Myrla Halsted 08/12/2023, 5:50 PM

## 2023-08-12 NOTE — Progress Notes (Signed)
ANTICOAGULATION CONSULT NOTE - Initial Consult  Pharmacy Consult for apixaban Indication: atrial fibrillation and stroke  Allergies  Allergen Reactions   Penicillins Other (See Comments)    Unknown reaction during childhood    Patient Measurements: Height: 5' 6.73" (169.5 cm) Weight: 123.6 kg (272 lb 7.8 oz) IBW/kg (Calculated) : 65.48 Heparin Dosing Weight:   Vital Signs: Temp: 98.9 F (37.2 C) (09/14 1209) Temp Source: Oral (09/14 1209) BP: 174/95 (09/14 1347) Pulse Rate: 93 (09/14 1347)  Labs: Recent Labs    08/11/23 1003 08/11/23 1004 08/12/23 0616  HGB 14.8 15.3 15.3  HCT 43.9 45.0 44.4  PLT 274  --  263  APTT 28  --   --   LABPROT 14.1  --   --   INR 1.1  --   --   CREATININE 0.94 0.90 1.05    Estimated Creatinine Clearance: 97.4 mL/min (by C-G formula based on SCr of 1.05 mg/dL).   Medical History: Past Medical History:  Diagnosis Date   Hypertension    Obesity     Medications:  Medications Prior to Admission  Medication Sig Dispense Refill Last Dose   losartan-hydrochlorothiazide (HYZAAR) 100-12.5 MG tablet Take 1 tablet by mouth once daily 30 tablet 0 08/11/2023   metoprolol succinate (TOPROL-XL) 50 MG 24 hr tablet TAKE 1 TABLET BY MOUTH ONCE DAILY. TAKE WITH OR IMMEDIATELY FOLLOWING A MEAL 30 tablet 0 Past Week   Scheduled:    stroke: early stages of recovery book   Does not apply Once   apixaban  5 mg Oral BID   Chlorhexidine Gluconate Cloth  6 each Topical Daily   metoprolol succinate  50 mg Oral Daily   pantoprazole  40 mg Oral QHS   rosuvastatin  40 mg Oral Daily   Infusions:   Assessment: Pt presented with CVA and received TNK. Neuro ordered apixaban for anticoagulation.   Goal of Therapy:  Monitor platelets by anticoagulation protocol: Yes   Plan:  Apixaban 5mg  BID Rx will follow peripherally  Ulyses Southward, PharmD, BCIDP, AAHIVP, CPP Infectious Disease Pharmacist 08/12/2023 2:43 PM

## 2023-08-12 NOTE — Progress Notes (Signed)
Pharmacy Electrolyte Replacement  Recent Labs:  Recent Labs    08/12/23 0616  K 2.7*  CREATININE 1.05    Low Critical Values (K </= 2.5, Phos </= 1, Mg </= 1) Present: K = 2.7  MD Contacted: Stroke Team  Plan: KDur x 2  Ulyses Southward, PharmD, BCIDP, AAHIVP, CPP Infectious Disease Pharmacist 08/12/2023 7:31 AM

## 2023-08-12 NOTE — Evaluation (Signed)
Physical Therapy Evaluation Patient Details Name: Stanley Wilkerson MRN: [id number] DOB: [birth year] Today's Date: 08/12/2023  History of Present Illness  Patient is 57 y.o. male presented to ED for LT sided facial and Lt UE numbness. He developed numbness at work around 8 AM on 08/11/23 and possible slurred speech. 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 PMH significant for HTN, obesity, and Rt cataract surgery in 2021.   Clinical Impression  Stanley Wilkerson is 57 y.o. male admitted with above HPI and diagnosis. Patient is currently limited by functional impairments below (see PT problem list). Patient lives alone and is independent at baseline. Overall pt progressing well with mobility and requires supervision for transfers and CGA for gait with no AD. He is most limited by visual deficits and cognitive impairments with poor memory and generally decreased awareness of deficits. Patient will benefit from continued skilled PT interventions to address impairments and progress independence with mobility, recommending OPPT for Rt hip pain and balance impairments. Acute PT will follow and progress as able.         If plan is discharge home, recommend the following: A little help with walking and/or transfers;Direct supervision/assist for medications management;Assist for transportation;A little help with bathing/dressing/bathroom   Can travel by private vehicle        Equipment Recommendations None recommended by PT  Recommendations for Other Services       Functional Status Assessment Patient has had a recent decline in their functional status and demonstrates the ability to make significant improvements in function in a reasonable and predictable amount of time.     Precautions / Restrictions Precautions Precautions: Fall Restrictions Weight Bearing Restrictions: No      Mobility  Bed Mobility Overal bed mobility:  Needs Assistance Bed Mobility: Supine to Sit     Supine to sit: Supervision, HOB elevated, Used rails     General bed mobility comments: use of bed features    Transfers Overall transfer level: Needs assistance Equipment used: None Transfers: Sit to/from Stand Sit to Stand: Contact guard assist           General transfer comment: guard for safety, pt with wide BOS to stand    Ambulation/Gait Ambulation/Gait assistance: Contact guard assist, Supervision Gait Distance (Feet): 200 Feet Assistive device: None Gait Pattern/deviations: Step-through pattern, Decreased stride length, Wide base of support, Trendelenburg Gait velocity: pace slightly decr     General Gait Details: pt reports Rt hip deficits/pain for ~3 years. pt has slight trendelenburg pattern bil. overall steady and VSS with no device.  Stairs            Wheelchair Mobility     Tilt Bed    Modified Rankin (Stroke Patients Only) Modified Rankin (Stroke Patients Only) Pre-Morbid Rankin Score: No significant disability Modified Rankin: Slight disability     Balance Overall balance assessment: Needs assistance Sitting-balance support: Feet supported Sitting balance-Leahy Scale: Good Sitting balance - Comments: able to don socks at EOB   Standing balance support: During functional activity, No upper extremity supported Standing balance-Leahy Scale: Good                               Pertinent Vitals/Pain Pain Assessment Pain Assessment: No/denies pain    Home Living Family/patient expects to be discharged to:: Private residence Living Arrangements: Alone Available Help at Discharge: Family (brother and friends) Type of  Home: House Home Access: Stairs to enter   [employer] of Steps: 2   Home Layout: Two level;Able to live on main level with bedroom/bathroom        Prior Function Prior Level of Function : Independent/Modified  Independent;Working/employed;Driving             Mobility Comments: no AD ADLs Comments: independent in ADL and IADL, does maintenance     Extremity/Trunk Assessment   Upper Extremity Assessment Upper Extremity Assessment: Defer to OT evaluation    Lower Extremity Assessment Lower Extremity Assessment: Overall WFL for tasks assessed;RLE deficits/detail;LLE deficits/detail RLE Deficits / Details: grossly 4+/5 throughout bil LE's RLE Sensation: WNL RLE Coordination: WNL LLE Deficits / Details: grossly 4+/5 throughout bil LE's LLE Sensation: WNL LLE Coordination: WNL    Cervical / Trunk Assessment Cervical / Trunk Assessment: Kyphotic;Other exceptions Cervical / Trunk Exceptions: habitus  Communication   Communication Communication: No apparent difficulties  Cognition Arousal: Alert Behavior During Therapy: WFL for tasks assessed/performed Overall Cognitive Status: Impaired/Different from baseline Area of Impairment: Awareness, Safety/judgement, Memory                     Memory: Decreased short-term memory   Safety/Judgement: Decreased awareness of deficits, Decreased awareness of safety Awareness: Emergent            General Comments General comments (skin integrity, edema, etc.): Reviewed BeFAST: with pt and pt verbalized acronym with cuing, unable to recall meaning of all 4 letters without cues. Educated on lifestyle chagnes that can be made to reduce risk of stroke.    Exercises     Assessment/Plan    PT Assessment Patient needs continued PT services  PT Problem List Decreased activity tolerance;Decreased balance;Decreased mobility;Decreased cognition;Decreased safety awareness;Decreased knowledge of precautions;Obesity       PT Treatment Interventions DME instruction;Gait training;Stair training;Functional mobility training;Therapeutic activities;Therapeutic exercise;Balance training;Neuromuscular re-education;Cognitive  remediation;Patient/family education    PT Goals (Current goals can be found in the Care Plan section)  Acute Rehab PT Goals Patient Stated Goal: get home and back to normal independence PT Goal Formulation: With patient Time For Goal Achievement: 08/26/23 Potential to Achieve Goals: Good Additional Goals Additional Goal #1: pt will verbalize be FAST acronym and 3 lifestyle changes he can implement to reduce risk of stroke.    Frequency Min 1X/week     Co-evaluation               AM-PAC PT "6 Clicks" Mobility  Outcome Measure Help needed turning from your back to your side while in a flat bed without using bedrails?: None Help needed moving from lying on your back to sitting on the side of a flat bed without using bedrails?: A Little Help needed moving to and from a bed to a chair (including a wheelchair)?: A Little Help needed standing up from a chair using your arms (e.g., wheelchair or bedside chair)?: A Little Help needed to walk in hospital room?: A Little Help needed climbing 3-5 steps with a railing? : A Little 6 Click Score: 19    End of Session Equipment Utilized During Treatment: Gait belt Activity Tolerance: Patient tolerated treatment well Patient left: in chair;with call bell/phone within reach Nurse Communication: Mobility status PT Visit Diagnosis: Other abnormalities of gait and mobility (R26.89);Muscle weakness (generalized) (M62.81);Difficulty in walking, not elsewhere classified (R26.2);Other symptoms and signs involving the nervous system (R29.898);Unsteadiness on feet (R26.81)    Time: 8119-1478 PT Time Calculation (min) (ACUTE ONLY): 17 min  Charges:   PT Evaluation $PT Eval Low Complexity: 1 Low   PT General Charges $$ ACUTE PT VISIT: 1 Visit         Wynn Maudlin, DPT Acute Rehabilitation Services Office [phone]  08/12/23 3:01 PM

## 2023-08-12 NOTE — Progress Notes (Signed)
STROKE TEAM PROGRESS NOTE   SUBJECTIVE (INTERVAL HISTORY) His RN is at the bedside.  Overall his condition is completely resolved. Pt felt his numbness has resolved. MRI showed small right thalamic infarct. However, his BP still on the high end. He stated that his cardiologist is Dr. Marni Griffon and he was given eliquis before but he stopped it as his mom taking eliquis had vaginal bleeding.    OBJECTIVE Temp:  [98 F (36.7 C)-99 F (37.2 C)] 98.5 F (36.9 C) (09/14 1700) Pulse Rate:  [77-98] 89 (09/14 1700) Resp:  [0-25] 17 (09/14 1700) BP: (112-191)/(70-140) 175/109 (09/14 1700) SpO2:  [91 %-98 %] 98 % (09/14 1700)  Recent Labs  Lab 08/11/23 0944 08/11/23 1206  GLUCAP 150* 134*   Recent Labs  Lab 08/11/23 1003 08/11/23 1004 08/12/23 0616  NA 136 138 137  K 3.0* 3.1* 2.7*  CL 98 98 97*  CO2 27  --  27  GLUCOSE 150* 150* 151*  BUN 12 14 13   CREATININE 0.94 0.90 1.05  CALCIUM 9.2  --  8.8*   Recent Labs  Lab 08/11/23 1003  AST 20  ALT 19  ALKPHOS 66  BILITOT 1.2  PROT 7.2  ALBUMIN 3.6   Recent Labs  Lab 08/11/23 1003 08/11/23 1004 08/12/23 0616  WBC 8.1  --  5.9  NEUTROABS 6.1  --   --   HGB 14.8 15.3 15.3  HCT 43.9 45.0 44.4  MCV 86.9  --  85.9  PLT 274  --  263   No results for input(s): "CKTOTAL", "CKMB", "CKMBINDEX", "TROPONINI" in the last 168 hours. Recent Labs    08/11/23 1003  LABPROT 14.1  INR 1.1   No results for input(s): "COLORURINE", "LABSPEC", "PHURINE", "GLUCOSEU", "HGBUR", "BILIRUBINUR", "KETONESUR", "PROTEINUR", "UROBILINOGEN", "NITRITE", "LEUKOCYTESUR" in the last 72 hours.  Invalid input(s): "APPERANCEUR"     Component Value Date/Time   CHOL 208 (H) 08/12/2023 0616   CHOL 210 (H) 06/16/2022 0927   TRIG 122 08/12/2023 0616   HDL 41 08/12/2023 0616   HDL 41 06/16/2022 0927   CHOLHDL 5.1 08/12/2023 0616   VLDL 24 08/12/2023 0616   LDLCALC 143 (H) 08/12/2023 0616   LDLCALC 145 (H) 06/16/2022 0927   Lab Results  Component Value  Date   HGBA1C 7.5 (H) 08/11/2023      Component Value Date/Time   LABOPIA NONE DETECTED 08/11/2023 2312   COCAINSCRNUR NONE DETECTED 08/11/2023 2312   LABBENZ NONE DETECTED 08/11/2023 2312   AMPHETMU NONE DETECTED 08/11/2023 2312   THCU NONE DETECTED 08/11/2023 2312   LABBARB NONE DETECTED 08/11/2023 2312    Recent Labs  Lab 08/11/23 1003  ETH <10    I have personally reviewed the radiological images below and agree with the radiology interpretations.  ECHOCARDIOGRAM COMPLETE  Result Date: 08/12/2023    ECHOCARDIOGRAM REPORT   Patient Name:   Stanley Wilkerson Date of Exam: 08/12/2023 Medical Rec #:  [id number]             Height:       66.7 in Accession #:    [id number]            Weight:       272.5 lb Date of Birth:  1966/08/18             BSA:          2.299 m Patient Age:    57 years  BP:           151/92 mmHg Patient Gender: M                     HR:           95 bpm. Exam Location:  Inpatient Procedure: 2D Echo, Cardiac Doppler and Color Doppler Indications:    Stroke  History:        Patient has prior history of Echocardiogram examinations, most                 recent 11/29/2020. Risk Factors:Hypertension.  Sonographer:    Darlys Gales Referring Phys: [id number] ASHISH ARORA IMPRESSIONS  1. Left ventricular ejection fraction, by estimation, is 55 to 60%. The left ventricle has normal function. The left ventricle has no regional wall motion abnormalities. There is moderate concentric left ventricular hypertrophy. Left ventricular diastolic function could not be evaluated.  2. Right ventricular systolic function is normal. The right ventricular size is normal.  3. Left atrial size was moderately dilated.  4. Right atrial size was moderately dilated.  5. The mitral valve is normal in structure. No evidence of mitral valve regurgitation.  6. The aortic valve is tricuspid. Aortic valve regurgitation is not visualized. Aortic valve sclerosis is present, with no evidence of aortic  valve stenosis.  7. The inferior vena cava is dilated in size with >50% respiratory variability, suggesting right atrial pressure of 8 mmHg. Comparison(s): Prior images reviewed side by side. The rhythm is now atrial fibrillation, otherwise there is little change. FINDINGS  Left Ventricle: Left ventricular ejection fraction, by estimation, is 55 to 60%. The left ventricle has normal function. The left ventricle has no regional wall motion abnormalities. The left ventricular internal cavity size was normal in size. There is  moderate concentric left ventricular hypertrophy. Left ventricular diastolic function could not be evaluated due to atrial fibrillation. Left ventricular diastolic function could not be evaluated. Right Ventricle: The right ventricular size is normal. Right vetricular wall thickness was not well visualized. Right ventricular systolic function is normal. Left Atrium: Left atrial size was moderately dilated. Right Atrium: Right atrial size was moderately dilated. Pericardium: There is no evidence of pericardial effusion. Mitral Valve: The mitral valve is normal in structure. No evidence of mitral valve regurgitation. Tricuspid Valve: The tricuspid valve is normal in structure. Tricuspid valve regurgitation is not demonstrated. Aortic Valve: The aortic valve is tricuspid. Aortic valve regurgitation is not visualized. Aortic valve sclerosis is present, with no evidence of aortic valve stenosis. Aortic valve mean gradient measures 3.0 mmHg. Aortic valve peak gradient measures 4.8  mmHg. Aortic valve area, by VTI measures 2.51 cm. Pulmonic Valve: The pulmonic valve was grossly normal. Pulmonic valve regurgitation is not visualized. No evidence of pulmonic stenosis. Aorta: The aortic root and ascending aorta are structurally normal, with no evidence of dilitation. Venous: The inferior vena cava is dilated in size with greater than 50% respiratory variability, suggesting right atrial pressure of 8 mmHg.  IAS/Shunts: No atrial level shunt detected by color flow Doppler.  LEFT VENTRICLE PLAX 2D LVIDd:         5.00 cm   Diastology LVIDs:         3.70 cm   LV e' medial:    6.74 cm/s LV PW:         1.60 cm   LV E/e' medial:  12.7 LV IVS:        1.50 cm   LV e'  lateral:   8.70 cm/s LVOT diam:     2.00 cm   LV E/e' lateral: 9.9 LV SV:         50 LV SV Index:   22 LVOT Area:     3.14 cm  RIGHT VENTRICLE RV S prime:     9.46 cm/s TAPSE (M-mode): 1.9 cm LEFT ATRIUM             Index        RIGHT ATRIUM           Index LA Vol (A2C):   72.8 ml 31.66 ml/m  RA Area:     22.70 cm LA Vol (A4C):   61.7 ml 26.83 ml/m  RA Volume:   68.40 ml  29.75 ml/m LA Biplane Vol: 67.4 ml 29.31 ml/m  AORTIC VALVE AV Area (Vmax):    2.26 cm AV Area (Vmean):   2.25 cm AV Area (VTI):     2.51 cm AV Vmax:           110.00 cm/s AV Vmean:          89.900 cm/s AV VTI:            0.200 m AV Peak Grad:      4.8 mmHg AV Mean Grad:      3.0 mmHg LVOT Vmax:         79.00 cm/s LVOT Vmean:        64.400 cm/s LVOT VTI:          0.160 m LVOT/AV VTI ratio: 0.80  AORTA Ao Root diam: 3.50 cm Ao Asc diam:  3.70 cm MITRAL VALVE MV Area (PHT): 3.63 cm    SHUNTS MV Decel Time: 209 msec    Systemic VTI:  0.16 m MV E velocity: 85.90 cm/s  Systemic Diam: 2.00 cm Rachelle Hora Croitoru MD Electronically signed by Thurmon Fair MD Signature Date/Time: 08/12/2023/12:06:26 PM    Final    MR BRAIN WO CONTRAST  Result Date: 08/12/2023 CLINICAL DATA:  Stroke follow-up EXAM: MRI HEAD WITHOUT CONTRAST TECHNIQUE: Multiplanar, multiecho pulse sequences of the brain and surrounding structures were obtained without intravenous contrast. COMPARISON:  Head CT and CTA from yesterday. FINDINGS: Brain: Small focus of weakly restricted diffusion in the right thalamus. Chronic small vessel ischemia in the cerebral white matter is fairly mild, although greater than expected for age. Old microhemorrhage in the right thalamus, separate from the area of diffusion hyperintensity. Normal  brain volume. No hydrocephalus, mass, or collection. Vascular: Major flow voids are preserved Skull and upper cervical spine: Normal marrow signal Sinuses/Orbits: Negative IMPRESSION: 1. Small acute or subacute infarct in the right thalamus. 2. Mild but still premature for age chronic small vessel ischemia Electronically Signed   By: Tiburcio Pea M.D.   On: 08/12/2023 10:38   CT ANGIO HEAD NECK W WO CM (CODE STROKE)  Result Date: 08/11/2023 CLINICAL DATA:  Provided history: Neuro deficit, acute, stroke suspected. Additional history provided: Left-sided deficits. EXAM: CT ANGIOGRAPHY HEAD AND NECK WITH AND WITHOUT CONTRAST TECHNIQUE: Multidetector CT imaging of the head and neck was performed using the standard protocol during bolus administration of intravenous contrast. Multiplanar CT image reconstructions and MIPs were obtained to evaluate the vascular anatomy. Carotid stenosis measurements (when applicable) are obtained utilizing NASCET criteria, using the distal internal carotid diameter as the denominator. RADIATION DOSE REDUCTION: This exam was performed according to the departmental dose-optimization program which includes automated exposure control, adjustment of the mA and/or kV according to  patient size and/or use of iterative reconstruction technique. CONTRAST:  75mL OMNIPAQUE IOHEXOL 350 MG/ML SOLN COMPARISON:  Non-contrast head CT performed earlier today 08/11/2023. Brain MRI 02/07/2021. FINDINGS: CTA NECK FINDINGS Aortic arch: Standard aortic branching. Atherosclerotic plaque within the visualized aortic arch and proximal major branch vessels of the neck. No hemodynamically significant innominate or proximal subclavian artery stenosis. Right carotid system: CCA and ICA patent within the neck. Atherosclerotic plaque about the carotid bifurcation and within the proximal ICA resulting in less than 50% stenosis. Left carotid system: CCA and ICA patent within the neck. Within the neck without  measurable stenosis. Mild atherosclerotic plaque about the carotid bifurcation and within the proximal ICA. Vertebral arteries: Vertebral arteries codominant and patent within the neck without stenosis or significant atherosclerotic disease. Skeleton: Spondylosis at the cervical and visualized upper thoracic levels. Other neck: No neck mass or cervical lymphadenopathy. Upper chest: No consolidation within the imaged lung apices. Review of the MIP images confirms the above findings CTA HEAD FINDINGS Anterior circulation: The intracranial internal carotid arteries are patent. Nonstenotic atherosclerotic plaque within both vessels The M1 middle cerebral arteries are patent. No M2 proximal branch occlusion or high-grade proximal stenosis. The anterior cerebral arteries are patent. No intracranial aneurysm is identified. Posterior circulation: The intracranial vertebral arteries are patent. Nonstenotic atherosclerotic plaque within the bilateral V4 segments. The basilar artery is patent. Nonstenotic atherosclerotic plaque within the proximal basilar artery. The posterior cerebral arteries are patent. Posterior communicating arteries are diminutive or absent, bilaterally. Venous sinuses: Within the limitations of contrast timing, no convincing thrombus. Anatomic variants: As described. Review of the MIP images confirms the above findings No emergent large vessel occlusion identified. These results were communicated to Dr. Wilford Corner at 10:50 amon 9/13/2024by text page via the [facility] messaging system. Please note, there was a delay in interpretation and results communication for this examination due to IT issues and delayed image transfer. IMPRESSION: CTA neck: 1. The common carotid and internal carotid arteries are patent within the neck. Atherosclerotic plaque bilaterally without hemodynamically significant stenosis (50% or greater). 2. The vertebral arteries are patent within the neck without stenosis or significant  atherosclerotic disease. 3. Aortic Atherosclerosis (ICD10-I70.0). CTA head: 1. No intracranial large vessel occlusion or proximal high-grade arterial stenosis identified. 2. Non-stenotic atherosclerotic plaque within the intracranial internal carotid arteries, intracranial vertebral arteries and basilar artery. Electronically Signed   By: Jackey Loge D.O.   On: 08/11/2023 11:09   CT HEAD CODE STROKE WO CONTRAST  Result Date: 08/11/2023 CLINICAL DATA:  Code stroke. Neuro deficit, acute, stroke suspected. EXAM: CT HEAD WITHOUT CONTRAST TECHNIQUE: Contiguous axial images were obtained from the base of the skull through the vertex without intravenous contrast. RADIATION DOSE REDUCTION: This exam was performed according to the departmental dose-optimization program which includes automated exposure control, adjustment of the mA and/or kV according to patient size and/or use of iterative reconstruction technique. COMPARISON:  MRI brain 02/07/2021. FINDINGS: Brain: No acute hemorrhage. Unchanged mild chronic small-vessel disease. Cortical gray-white differentiation is otherwise preserved. Prominence of the ventricles and sulci within expected range for age. No hydrocephalus or extra-axial collection. No mass effect or midline shift. Vascular: No hyperdense vessel or unexpected calcification. Skull: No calvarial fracture or suspicious bone lesion. Skull base is unremarkable. Sinuses/Orbits: No acute finding. Other: None. ASPECTS [facility] Stroke Program Early CT Score) - Ganglionic level infarction (caudate, lentiform nuclei, internal capsule, insula, M1-M3 cortex): 7 - Supraganglionic infarction (M4-M6 cortex): 3 Total score (0-10 with 10 being normal): 10 IMPRESSION: No  acute intracranial hemorrhage or evidence of acute large vessel territory infarct. ASPECT score is 10. Code stroke imaging results were communicated on 08/11/2023 at 10:22 am to provider Dr. Wilford Corner via secure text paging. Electronically Signed   By:  Orvan Falconer M.D.   On: 08/11/2023 10:58     PHYSICAL EXAM  Temp:  [98 F (36.7 C)-99 F (37.2 C)] 98.5 F (36.9 C) (09/14 1700) Pulse Rate:  [77-98] 89 (09/14 1700) Resp:  [0-25] 17 (09/14 1700) BP: (112-191)/(70-140) 175/109 (09/14 1700) SpO2:  [91 %-98 %] 98 % (09/14 1700)  General - Obese, well developed, in no apparent distress.  Ophthalmologic - fundi not visualized due to noncooperation.  Cardiovascular - irregularly irregular heart rate and rhythm.  Mental Status -  Level of arousal and orientation to time, place, and person were intact. Language including expression, naming, repetition, comprehension was assessed and found intact. Fund of Knowledge was assessed and was intact.  Cranial Nerves II - XII - II - Visual field intact OU. III, IV, VI - Extraocular movements intact. V - Facial sensation intact bilaterally. VII - Facial movement intact bilaterally. VIII - Hearing & vestibular intact bilaterally. X - Palate elevates symmetrically. XI - Chin turning & shoulder shrug intact bilaterally. XII - Tongue protrusion intact.  Motor Strength - The patient's strength was normal in all extremities and pronator drift was absent.  Bulk was normal and fasciculations were absent.   Motor Tone - Muscle tone was assessed at the neck and appendages and was normal.  Reflexes - The patient's reflexes were symmetrical in all extremities and he had no pathological reflexes.  Sensory - Light touch, temperature/pinprick were assessed and were symmetrical.    Coordination - The patient had normal movements in the hands and feet with no ataxia or dysmetria.  Tremor was absent.  Gait and Station - deferred.   ASSESSMENT/PLAN Stanley Wilkerson is a 57 y.o. male with history of HTN, DM, afib not on AC, obesity admitted for left face and arm numbness. TNK given.    Stroke:  right small acute infarct likely secondary to small vessel disease based on location and risk  factors, but pt does have afib not on AC CT no acute finding CTA head and neck mild atherosclerosis MRI small right thalamic infarct 2D Echo  EF 55-60% LDL 143 HgbA1c 7.5 UDS neg Eliquis for VTE prophylaxis No antithrombotic prior to admission, now on Eliquis (apixaban) daily given afib Patient counseled to be compliant with his antithrombotic medications Ongoing aggressive stroke risk factor management Therapy recommendations:  outpt PT and OT Disposition:  pending  Chronic afib EKG 9/13 showed afib RVR Now on metoprolol XR 50 HR controlled Was on eliquis before but stopped by himself as his mom taking eliquis caused vaginal bleeding Follows with Dr. Marni Griffon  Now on eliquis 5mg  bid  Diabetes HgbA1c 7.5 goal < 7.0 Uncontrolled CBG monitoring SSI DM education and close PCP follow up  Hypertension Home meds: hyzaar 100-12.5 Now on metoprolol XR 50 and cozaar 100 BP stable on the high end Long term BP goal normotensive  Hyperlipidemia Home meds:  none  LDL 143, goal < 70 Now on crestor 40 Continue statin at discharge  Other Stroke Risk Factors Obesity, Body mass index is 43.02 kg/m.  Former smoker  Other Active Problems Hypokalemia, K 3.1->2.7-> supplement  Hospital day # 1  This patient is critically ill due to acute stroke s/p TNK, afib not on AC, elevated BP and at  significant risk of neurological worsening, death form recurrent stroke, hemorrhagic conversion, bleeding from TNK, heart failure. This patient's care requires constant monitoring of vital signs, hemodynamics, respiratory and cardiac monitoring, review of multiple databases, neurological assessment, discussion with family, other specialists and medical decision making of high complexity. I spent 35 minutes of neurocritical care time in the care of this patient.  Marvel Plan, MD PhD Stroke Neurology 08/12/2023 5:50 PM    To contact Stroke Continuity provider, please refer to [email or web address]. After hours,  contact General Neurology

## 2023-08-13 DIAGNOSIS — I482 Chronic atrial fibrillation, unspecified: Secondary | ICD-10-CM

## 2023-08-13 DIAGNOSIS — I639 Cerebral infarction, unspecified: Secondary | ICD-10-CM | POA: Diagnosis not present

## 2023-08-13 LAB — GLUCOSE, CAPILLARY
Glucose-Capillary: 169 mg/dL — ABNORMAL HIGH (ref 70–99)
Glucose-Capillary: 128 mg/dL — ABNORMAL HIGH (ref 70–99)

## 2023-08-13 LAB — BASIC METABOLIC PANEL
Anion gap: 11 (ref 5–15)
BUN: 17 mg/dL (ref 6–20)
CO2: 24 mmol/L (ref 22–32)
Calcium: 8.5 mg/dL — ABNORMAL LOW (ref 8.9–10.3)
Chloride: 101 mmol/L (ref 98–111)
Creatinine, Ser: 0.86 mg/dL (ref 0.61–1.24)
GFR, Estimated: >60 mL/min (ref >60)
Glucose, Bld: 168 mg/dL — ABNORMAL HIGH (ref 70–99)
Potassium: 3.6 mmol/L (ref 3.5–5.1)
Sodium: 136 mmol/L (ref 135–145)

## 2023-08-13 LAB — CBC
HCT: 44.2 % (ref 39.0–52.0)
Hemoglobin: 14.8 g/dL (ref 13.0–17.0)
MCH: 29.2 pg (ref 26.0–34.0)
MCHC: 33.5 g/dL (ref 30.0–36.0)
MCV: 87.2 fL (ref 80.0–100.0)
Platelets: 231 10*3/uL (ref 150–400)
RBC: 5.07 MIL/uL (ref 4.22–5.81)
RDW: 13.5 % (ref 11.5–15.5)
WBC: 6.9 10*3/uL (ref 4.0–10.5)
nRBC: 0 % (ref 0.0–0.2)

## 2023-08-13 MED ORDER — HYDROCHLOROTHIAZIDE 12.5 MG PO TABS
12.5000 mg | ORAL_TABLET | Freq: Once | ORAL | Status: AC
  Administered 2023-08-13: 12.5 mg via ORAL
  Filled 2023-08-13: qty 1

## 2023-08-13 MED ORDER — HYDROCHLOROTHIAZIDE 12.5 MG PO TABS: 12.5000 mg | ORAL_TABLET | Freq: Every day | ORAL | Status: DC

## 2023-08-13 MED ORDER — LOSARTAN POTASSIUM-HCTZ 100-12.5 MG PO TABS: 1.0000 | ORAL_TABLET | Freq: Every day | ORAL | Status: DC

## 2023-08-13 MED ORDER — APIXABAN 5 MG PO TABS: 5.0000 mg | ORAL_TABLET | Freq: Two times a day (BID) | ORAL | 0 refills | Status: DC

## 2023-08-13 MED ORDER — ROSUVASTATIN CALCIUM 40 MG PO TABS: 40.0000 mg | ORAL_TABLET | Freq: Every day | ORAL | 0 refills | Status: DC

## 2023-08-13 MED ORDER — LOSARTAN POTASSIUM 50 MG PO TABS: 100.0000 mg | ORAL_TABLET | Freq: Every day | ORAL | Status: DC

## 2023-08-13 NOTE — Discharge Summary (Addendum)
Stroke Discharge Summary  Patient ID: Stanley Wilkerson   MRN: [id number]      DOB: [birth year]  Date of Admission: 08/11/2023 Date of Discharge: 08/13/2023  Attending Physician:  Stroke, Md, MD Patient's PCP:  Ronnald Nian, MD  DISCHARGE PRIMARY DIAGNOSIS:  Right small acute infarct likely secondary to small vessel disease based on location and risk factors    Secondary diagnosis Chronic afib DM HTN HLD obesity  Allergies as of 08/13/2023       Reactions   Penicillins Other (See Comments)   Unknown reaction during childhood        Medication List     TAKE these medications    apixaban 5 MG Tabs tablet Commonly known as: ELIQUIS Take 1 tablet (5 mg total) by mouth 2 (two) times daily.   losartan-hydrochlorothiazide 100-12.5 MG tablet Commonly known as: HYZAAR Take 1 tablet by mouth once daily   metoprolol succinate 50 MG 24 hr tablet Commonly known as: TOPROL-XL TAKE 1 TABLET BY MOUTH ONCE DAILY. TAKE WITH OR IMMEDIATELY FOLLOWING A MEAL   rosuvastatin 40 MG tablet Commonly known as: CRESTOR Take 1 tablet (40 mg total) by mouth daily. Start taking on: August 14, 2023        LABORATORY STUDIES CBC    Component Value Date/Time   WBC 6.9 08/13/2023 0707   RBC 5.07 08/13/2023 0707   HGB 14.8 08/13/2023 0707   HGB 14.0 06/16/2022 0927   HCT 44.2 08/13/2023 0707   HCT 40.7 06/16/2022 0927   PLT 231 08/13/2023 0707   PLT 262 06/16/2022 0927   MCV 87.2 08/13/2023 0707   MCV 88 06/16/2022 0927   MCH 29.2 08/13/2023 0707   MCHC 33.5 08/13/2023 0707   RDW 13.5 08/13/2023 0707   RDW 14.5 06/16/2022 0927   LYMPHSABS 1.2 08/11/2023 1003   LYMPHSABS 1.4 06/16/2022 0927   MONOABS 0.5 08/11/2023 1003   EOSABS 0.2 08/11/2023 1003   EOSABS 0.2 06/16/2022 0927   BASOSABS 0.1 08/11/2023 1003   BASOSABS 0.0 06/16/2022 0927   CMP    Component Value Date/Time   NA 136 08/13/2023 0707   NA 140 06/16/2022 0927   K 3.6 08/13/2023 0707   CL 101  08/13/2023 0707   CO2 24 08/13/2023 0707   GLUCOSE 168 (H) 08/13/2023 0707   BUN 17 08/13/2023 0707   BUN 16 06/16/2022 0927   CREATININE 0.86 08/13/2023 0707   CREATININE 0.99 08/26/2014 1501   CALCIUM 8.5 (L) 08/13/2023 0707   PROT 7.2 08/11/2023 1003   PROT 7.1 06/16/2022 0927   ALBUMIN 3.6 08/11/2023 1003   ALBUMIN 4.1 06/16/2022 0927   AST 20 08/11/2023 1003   ALT 19 08/11/2023 1003   ALKPHOS 66 08/11/2023 1003   BILITOT 1.2 08/11/2023 1003   BILITOT 1.3 (H) 06/16/2022 0927   GFRNONAA >60 08/13/2023 0707   GFRAA 111 05/06/2020 1000   COAGS Lab Results  Component Value Date   INR 1.1 08/11/2023   Lipid Panel    Component Value Date/Time   CHOL 208 (H) 08/12/2023 0616   CHOL 210 (H) 06/16/2022 0927   TRIG 122 08/12/2023 0616   HDL 41 08/12/2023 0616   HDL 41 06/16/2022 0927   CHOLHDL 5.1 08/12/2023 0616   VLDL 24 08/12/2023 0616   LDLCALC 143 (H) 08/12/2023 0616   LDLCALC 145 (H) 06/16/2022 0927   HgbA1C  Lab Results  Component Value Date   HGBA1C 7.5 (H) 08/11/2023  Alcohol Level    Component Value Date/Time   ETH <10 08/11/2023 1003     SIGNIFICANT DIAGNOSTIC STUDIES ECHOCARDIOGRAM COMPLETE  Result Date: 08/12/2023    ECHOCARDIOGRAM REPORT   Patient Name:   Stanley Wilkerson Date of Exam: 08/12/2023 Medical Rec #:  [id number]             Height:       66.7 in Accession #:    [id number]            Weight:       272.5 lb Date of Birth:  04/10/1966             BSA:          2.299 m Patient Age:    57 years              BP:           151/92 mmHg Patient Gender: M                     HR:           95 bpm. Exam Location:  Inpatient Procedure: 2D Echo, Cardiac Doppler and Color Doppler Indications:    Stroke  History:        Patient has prior history of Echocardiogram examinations, most                 recent 11/29/2020. Risk Factors:Hypertension.  Sonographer:    Darlys Gales Referring Phys: [id number] ASHISH ARORA IMPRESSIONS  1. Left ventricular ejection  fraction, by estimation, is 55 to 60%. The left ventricle has normal function. The left ventricle has no regional wall motion abnormalities. There is moderate concentric left ventricular hypertrophy. Left ventricular diastolic function could not be evaluated.  2. Right ventricular systolic function is normal. The right ventricular size is normal.  3. Left atrial size was moderately dilated.  4. Right atrial size was moderately dilated.  5. The mitral valve is normal in structure. No evidence of mitral valve regurgitation.  6. The aortic valve is tricuspid. Aortic valve regurgitation is not visualized. Aortic valve sclerosis is present, with no evidence of aortic valve stenosis.  7. The inferior vena cava is dilated in size with >50% respiratory variability, suggesting right atrial pressure of 8 mmHg. Comparison(s): Prior images reviewed side by side. The rhythm is now atrial fibrillation, otherwise there is little change. FINDINGS  Left Ventricle: Left ventricular ejection fraction, by estimation, is 55 to 60%. The left ventricle has normal function. The left ventricle has no regional wall motion abnormalities. The left ventricular internal cavity size was normal in size. There is  moderate concentric left ventricular hypertrophy. Left ventricular diastolic function could not be evaluated due to atrial fibrillation. Left ventricular diastolic function could not be evaluated. Right Ventricle: The right ventricular size is normal. Right vetricular wall thickness was not well visualized. Right ventricular systolic function is normal. Left Atrium: Left atrial size was moderately dilated. Right Atrium: Right atrial size was moderately dilated. Pericardium: There is no evidence of pericardial effusion. Mitral Valve: The mitral valve is normal in structure. No evidence of mitral valve regurgitation. Tricuspid Valve: The tricuspid valve is normal in structure. Tricuspid valve regurgitation is not demonstrated. Aortic Valve:  The aortic valve is tricuspid. Aortic valve regurgitation is not visualized. Aortic valve sclerosis is present, with no evidence of aortic valve stenosis. Aortic valve mean gradient measures 3.0 mmHg. Aortic valve peak gradient measures 4.8  mmHg. Aortic valve  area, by VTI measures 2.51 cm. Pulmonic Valve: The pulmonic valve was grossly normal. Pulmonic valve regurgitation is not visualized. No evidence of pulmonic stenosis. Aorta: The aortic root and ascending aorta are structurally normal, with no evidence of dilitation. Venous: The inferior vena cava is dilated in size with greater than 50% respiratory variability, suggesting right atrial pressure of 8 mmHg. IAS/Shunts: No atrial level shunt detected by color flow Doppler.  LEFT VENTRICLE PLAX 2D LVIDd:         5.00 cm   Diastology LVIDs:         3.70 cm   LV e' medial:    6.74 cm/s LV PW:         1.60 cm   LV E/e' medial:  12.7 LV IVS:        1.50 cm   LV e' lateral:   8.70 cm/s LVOT diam:     2.00 cm   LV E/e' lateral: 9.9 LV SV:         50 LV SV Index:   22 LVOT Area:     3.14 cm  RIGHT VENTRICLE RV S prime:     9.46 cm/s TAPSE (M-mode): 1.9 cm LEFT ATRIUM             Index        RIGHT ATRIUM           Index LA Vol (A2C):   72.8 ml 31.66 ml/m  RA Area:     22.70 cm LA Vol (A4C):   61.7 ml 26.83 ml/m  RA Volume:   68.40 ml  29.75 ml/m LA Biplane Vol: 67.4 ml 29.31 ml/m  AORTIC VALVE AV Area (Vmax):    2.26 cm AV Area (Vmean):   2.25 cm AV Area (VTI):     2.51 cm AV Vmax:           110.00 cm/s AV Vmean:          89.900 cm/s AV VTI:            0.200 m AV Peak Grad:      4.8 mmHg AV Mean Grad:      3.0 mmHg LVOT Vmax:         79.00 cm/s LVOT Vmean:        64.400 cm/s LVOT VTI:          0.160 m LVOT/AV VTI ratio: 0.80  AORTA Ao Root diam: 3.50 cm Ao Asc diam:  3.70 cm MITRAL VALVE MV Area (PHT): 3.63 cm    SHUNTS MV Decel Time: 209 msec    Systemic VTI:  0.16 m MV E velocity: 85.90 cm/s  Systemic Diam: 2.00 cm Rachelle Hora Croitoru MD Electronically signed by  Thurmon Fair MD Signature Date/Time: 08/12/2023/12:06:26 PM    Final    MR BRAIN WO CONTRAST  Result Date: 08/12/2023 CLINICAL DATA:  Stroke follow-up EXAM: MRI HEAD WITHOUT CONTRAST TECHNIQUE: Multiplanar, multiecho pulse sequences of the brain and surrounding structures were obtained without intravenous contrast. COMPARISON:  Head CT and CTA from yesterday. FINDINGS: Brain: Small focus of weakly restricted diffusion in the right thalamus. Chronic small vessel ischemia in the cerebral white matter is fairly mild, although greater than expected for age. Old microhemorrhage in the right thalamus, separate from the area of diffusion hyperintensity. Normal brain volume. No hydrocephalus, mass, or collection. Vascular: Major flow voids are preserved Skull and upper cervical spine: Normal marrow signal Sinuses/Orbits: Negative IMPRESSION: 1. Small acute or subacute infarct in the  right thalamus. 2. Mild but still premature for age chronic small vessel ischemia Electronically Signed   By: Tiburcio Pea M.D.   On: 08/12/2023 10:38   CT ANGIO HEAD NECK W WO CM (CODE STROKE)  Result Date: 08/11/2023 CLINICAL DATA:  Provided history: Neuro deficit, acute, stroke suspected. Additional history provided: Left-sided deficits. EXAM: CT ANGIOGRAPHY HEAD AND NECK WITH AND WITHOUT CONTRAST TECHNIQUE: Multidetector CT imaging of the head and neck was performed using the standard protocol during bolus administration of intravenous contrast. Multiplanar CT image reconstructions and MIPs were obtained to evaluate the vascular anatomy. Carotid stenosis measurements (when applicable) are obtained utilizing NASCET criteria, using the distal internal carotid diameter as the denominator. RADIATION DOSE REDUCTION: This exam was performed according to the departmental dose-optimization program which includes automated exposure control, adjustment of the mA and/or kV according to patient size and/or use of iterative reconstruction  technique. CONTRAST:  75mL OMNIPAQUE IOHEXOL 350 MG/ML SOLN COMPARISON:  Non-contrast head CT performed earlier today 08/11/2023. Brain MRI 02/07/2021. FINDINGS: CTA NECK FINDINGS Aortic arch: Standard aortic branching. Atherosclerotic plaque within the visualized aortic arch and proximal major branch vessels of the neck. No hemodynamically significant innominate or proximal subclavian artery stenosis. Right carotid system: CCA and ICA patent within the neck. Atherosclerotic plaque about the carotid bifurcation and within the proximal ICA resulting in less than 50% stenosis. Left carotid system: CCA and ICA patent within the neck. Within the neck without measurable stenosis. Mild atherosclerotic plaque about the carotid bifurcation and within the proximal ICA. Vertebral arteries: Vertebral arteries codominant and patent within the neck without stenosis or significant atherosclerotic disease. Skeleton: Spondylosis at the cervical and visualized upper thoracic levels. Other neck: No neck mass or cervical lymphadenopathy. Upper chest: No consolidation within the imaged lung apices. Review of the MIP images confirms the above findings CTA HEAD FINDINGS Anterior circulation: The intracranial internal carotid arteries are patent. Nonstenotic atherosclerotic plaque within both vessels The M1 middle cerebral arteries are patent. No M2 proximal branch occlusion or high-grade proximal stenosis. The anterior cerebral arteries are patent. No intracranial aneurysm is identified. Posterior circulation: The intracranial vertebral arteries are patent. Nonstenotic atherosclerotic plaque within the bilateral V4 segments. The basilar artery is patent. Nonstenotic atherosclerotic plaque within the proximal basilar artery. The posterior cerebral arteries are patent. Posterior communicating arteries are diminutive or absent, bilaterally. Venous sinuses: Within the limitations of contrast timing, no convincing thrombus. Anatomic variants:  As described. Review of the MIP images confirms the above findings No emergent large vessel occlusion identified. These results were communicated to Dr. Wilford Corner at 10:50 amon 9/13/2024by text page via the [facility] messaging system. Please note, there was a delay in interpretation and results communication for this examination due to IT issues and delayed image transfer. IMPRESSION: CTA neck: 1. The common carotid and internal carotid arteries are patent within the neck. Atherosclerotic plaque bilaterally without hemodynamically significant stenosis (50% or greater). 2. The vertebral arteries are patent within the neck without stenosis or significant atherosclerotic disease. 3. Aortic Atherosclerosis (ICD10-I70.0). CTA head: 1. No intracranial large vessel occlusion or proximal high-grade arterial stenosis identified. 2. Non-stenotic atherosclerotic plaque within the intracranial internal carotid arteries, intracranial vertebral arteries and basilar artery. Electronically Signed   By: Jackey Loge D.O.   On: 08/11/2023 11:09   CT HEAD CODE STROKE WO CONTRAST  Result Date: 08/11/2023 CLINICAL DATA:  Code stroke. Neuro deficit, acute, stroke suspected. EXAM: CT HEAD WITHOUT CONTRAST TECHNIQUE: Contiguous axial images were obtained from the base of  the skull through the vertex without intravenous contrast. RADIATION DOSE REDUCTION: This exam was performed according to the departmental dose-optimization program which includes automated exposure control, adjustment of the mA and/or kV according to patient size and/or use of iterative reconstruction technique. COMPARISON:  MRI brain 02/07/2021. FINDINGS: Brain: No acute hemorrhage. Unchanged mild chronic small-vessel disease. Cortical gray-white differentiation is otherwise preserved. Prominence of the ventricles and sulci within expected range for age. No hydrocephalus or extra-axial collection. No mass effect or midline shift. Vascular: No hyperdense vessel or unexpected  calcification. Skull: No calvarial fracture or suspicious bone lesion. Skull base is unremarkable. Sinuses/Orbits: No acute finding. Other: None. ASPECTS ([place] Stroke Program Early CT Score) - Ganglionic level infarction (caudate, lentiform nuclei, internal capsule, insula, M1-M3 cortex): 7 - Supraganglionic infarction (M4-M6 cortex): 3 Total score (0-10 with 10 being normal): 10 IMPRESSION: No acute intracranial hemorrhage or evidence of acute large vessel territory infarct. ASPECT score is 10. Code stroke imaging results were communicated on 08/11/2023 at 10:22 am to provider Dr. Wilford Corner via secure text paging. Electronically Signed   By: Orvan Falconer M.D.   On: 08/11/2023 10:58       HISTORY OF PRESENT ILLNESS Ural Shor is a 57 y.o. male past medical history of obesity, hypertension,, diabetes, atrial fibrillation not on anticoagulation presented to the emergency department for evaluation of sudden onset of left face and arm numbness.  Reports he woke up normally this morning but was feeling a little tired but it was not until right around 8 AM when he had a sudden onset of numbness in the left face and left arm below his elbow up into the left hand.  He came to the emergency department, was seen in the ED and a code stroke was activated.  On my evaluation, his NIH stroke scale was 0.  He continued to complain of subjective left face and arm sensation of heaviness and numbness although touch sensation was normal. I had a detailed discussion of risks, benefits and alternatives of IV TNKase-he agreed to proceed. He also was hypotensive and required a dose of labetalol 10 mg IV x 1 to get his pressures to the point where he could receive IV TNKase.  Also there was delay in getting IV access.    LKW: 8 AM IV thrombolysis given?:  Yes Premorbid modified Rankin scale (mRS): 0  HOSPITAL COURSE 57 y.o. patient with history of HTN, DM, afib not on AC, obesity admitted for left face and arm  numbness. TNK given.   Stroke:  right small acute infarct likely secondary to small vessel disease based on location and risk factors, but pt does have afib not on AC CT no acute finding CTA head and neck mild atherosclerosis MRI small right thalamic infarct 2D Echo  EF 55-60% LDL 143 HgbA1c 7.5 UDS neg Eliquis for VTE prophylaxis No antithrombotic prior to admission, now on Eliquis (apixaban) daily given afib Patient counseled to be compliant with his antithrombotic medications Ongoing aggressive stroke risk factor management Therapy recommendations:  outpt PT and OT Disposition:  pending   Chronic afib EKG 9/13 showed afib RVR Now on metoprolol XR 50 HR controlled Was on eliquis before but stopped by himself as his mom taking eliquis caused vaginal bleeding Follows with Dr. Marni Griffon  Now on eliquis 5mg  bid  Diabetes HgbA1c 7.5 goal < 7.0 Uncontrolled CBG monitoring SSI DM education and close PCP follow up--discussed with patient    Hypertension Home meds: hyzaar 100-12.5 Now on metoprolol  XR 50 and home hyzaar 100-12.5 BP stable Long term BP goal normotensive Recommend close monitoring at home and PCP follow-up--discussed with patient   Hyperlipidemia Home meds:  none  LDL 143, goal < 70 Now on crestor 40 Continue statin at discharge   Other Stroke Risk Factors Obesity, Body mass index is 43.02 kg/m.  Former smoker   DISCHARGE EXAM  General - Obese, well developed, in no apparent distress.   Ophthalmologic - fundi not visualized due to noncooperation.   Cardiovascular - irregularly irregular heart rate and rhythm.   Mental Status -  Level of arousal and orientation to time, place, and person were intact. Language including expression, naming, repetition, comprehension was assessed and found intact. Fund of Knowledge was assessed and was intact.   Cranial Nerves II - XII - II - Visual field intact OU. III, IV, VI - Extraocular movements intact. V -  Facial sensation intact bilaterally. VII - Facial movement intact bilaterally. VIII - Hearing & vestibular intact bilaterally. X - Palate elevates symmetrically. XI - Chin turning & shoulder shrug intact bilaterally. XII - Tongue protrusion intact.   Motor Strength - The patient's strength was normal in all extremities and pronator drift was absent.  Bulk was normal and fasciculations were absent.   Motor Tone - Muscle tone was assessed at the neck and appendages and was normal.   Reflexes - The patient's reflexes were symmetrical in all extremities and he had no pathological reflexes.   Sensory - Light touch, temperature/pinprick were assessed and were symmetrical.     Coordination - The patient had normal movements in the hands and feet with no ataxia or dysmetria.  Tremor was absent.   Gait and Station - deferred.   Discharge Diet       Diet   Diet heart healthy/carb modified Fluid consistency: Thin   liquids  DISCHARGE PLAN Disposition: Home Eliquis (apixaban) daily for secondary stroke prevention and Afib Ongoing stroke risk factor control by Primary Care Physician at time of discharge Follow-up PCP Ronnald Nian, MD in 2 weeks. Follow-up in [facility] in 4 weeks, office to schedule an appointment. Able to see NP in clinic.  35 minutes were spent preparing discharge.   Pt seen by Neuro NP/APP and later by MD. Note/plan to be edited by MD as needed.    Lynnae January, DNP, AGACNP-BC [facility] Please use AMION for contact information & EPIC for messaging.  ATTENDING NOTE: I reviewed above note and agree with the assessment and plan. Pt was seen and examined.   No family at the bedside. Pt neuro intact but still has BP on the high end, we d/c losartan and resume his home hyzaar. Continue metoprolol. BP improved some. He will need close home BP monitoring and follow up with PCP. Continue eliquis and statin on discharge.  Follow up with cardiology Dr. Marni Griffon, PCP and neurology stroke clinic.   For detailed assessment and plan, please refer to above/below as I have made changes wherever appropriate.   Marvel Plan, MD PhD Stroke Neurology 08/13/2023 6:02 PM

## 2023-08-13 NOTE — TOC Initial Note (Signed)
Transition of Care [facility]) - Initial/Assessment Note    Patient Details  Name: Stanley Wilkerson MRN: [id number] Date of Birth: 10-02-1966  Transition of Care [facility]) CM/SW Contact:    Lawerance Sabal, RN Phone Number: 08/13/2023, 3:09 PM  Clinical Narrative:                  Provided patient with 30 day and $10 copay cards for Eliquis. He declined OP therapy referrals and DME.  No other TOC needs identified for DC    Barriers to Discharge: No Barriers Identified   Patient Goals and CMS Choice            Expected Discharge Plan and Services         Expected Discharge Date: 08/13/23               DME Arranged: N/A         HH Arranged: NA          Prior Living Arrangements/Services                       Activities of Daily Living      Permission Sought/Granted                  Emotional Assessment              Admission diagnosis:  Acute ischemic stroke [facility]) [I63.9] Patient Active Problem List   Diagnosis Date Noted   Acute ischemic stroke (HCC) 08/11/2023   Diabetic nephropathy associated with type 2 diabetes mellitus (HCC) 06/16/2022   Morbid obesity (HCC) 01/26/2021   History of COVID-19 01/26/2021   Glaucoma 01/26/2021   History of right cataract extraction 01/26/2021   Atrial fibrillation (HCC) 11/28/2020   Type 2 diabetes mellitus (HCC) 05/26/2020   Family history of heart disease in male family member before age 16 12/01/2014   Hyperlipidemia LDL goal <70 12/01/2014   LVH (left ventricular hypertrophy) 08/26/2014   Hypertension associated with diabetes (HCC) 08/26/2014   PCP:  Ronnald Nian, MD Pharmacy:   [facility] [address] [place] - [address] [place] [address] Phone: [phone] Fax: [phone]     Social Determinants of Health (SDOH) Social History: SDOH Screenings   Depression (PHQ2-9): Low Risk  (06/16/2022)  Tobacco Use: Medium Risk  (06/16/2022)   SDOH Interventions:     Readmission Risk Interventions     No data to display

## 2023-08-13 NOTE — Evaluation (Signed)
Speech Language Pathology Evaluation Patient Details Name: Stanley Wilkerson MRN: [id number] DOB: 1966-04-15 Today's Date: 08/13/2023 Time: 8295-6213 SLP Time Calculation (min) (ACUTE ONLY): 21 min  Problem List:  Patient Active Problem List   Diagnosis Date Noted   Acute ischemic stroke (HCC) 08/11/2023   Diabetic nephropathy associated with type 2 diabetes mellitus (HCC) 06/16/2022   Morbid obesity (HCC) 01/26/2021   History of COVID-19 01/26/2021   Glaucoma 01/26/2021   History of right cataract extraction 01/26/2021   Atrial fibrillation (HCC) 11/28/2020   Type 2 diabetes mellitus (HCC) 05/26/2020   Family history of heart disease in male family member before age 34 12/01/2014   Hyperlipidemia LDL goal <70 12/01/2014   LVH (left ventricular hypertrophy) 08/26/2014   Hypertension associated with diabetes (HCC) 08/26/2014   Past Medical History:  Past Medical History:  Diagnosis Date   Hypertension    Obesity    Past Surgical History:  Past Surgical History:  Procedure Laterality Date   CATARACT EXTRACTION Right 07/23/2020   HPI:      Assessment / Plan / Recommendation Clinical Impression  Pt reports living at home alone and handling all finances and medications independently. Prior to session, this SLP observed pt listing new medications and their frequency with RN with 100% accuracy using teachback method. He reports no acute concerns with cognition, although notes baseline memory difficulties. He scored a 24/30 on the SLUMS (a score of 27 or above is considered WFL) characterized by mild difficulty with selective attention and memory retrieval. Recommend SLP f/u on an OP basis to facilitate use of compensatory strategies. No further SLP f/u is necessary in the acute setting. Will s/o at this time.    SLP Assessment  SLP Recommendation/Assessment: All further Speech Lanaguage Pathology  needs can be addressed in the next venue of care SLP Visit Diagnosis: Cognitive  communication deficit (R41.841)    Recommendations for follow up therapy are one component of a multi-disciplinary discharge planning process, led by the attending physician.  Recommendations may be updated based on patient status, additional functional criteria and insurance authorization.    Follow Up Recommendations  Outpatient SLP    Assistance Recommended at Discharge  PRN  Functional Status Assessment Patient has had a recent decline in their functional status and demonstrates the ability to make significant improvements in function in a reasonable and predictable amount of time.  Frequency and Duration           SLP Evaluation Cognition  Overall Cognitive Status: Impaired/Different from baseline Arousal/Alertness: Awake/alert Orientation Level: Oriented X4 Attention: Selective Sustained Attention: Appears intact Selective Attention: Impaired Selective Attention Impairment: Verbal basic Memory: Impaired Memory Impairment: Retrieval deficit Awareness: Appears intact Problem Solving: Appears intact       Comprehension  Auditory Comprehension Overall Auditory Comprehension: Appears within functional limits for tasks assessed    Expression Expression Primary Mode of Expression: Verbal Verbal Expression Overall Verbal Expression: Appears within functional limits for tasks assessed   Oral / Motor  Oral Motor/Sensory Function Overall Oral Motor/Sensory Function: Within functional limits Motor Speech Overall Motor Speech: Appears within functional limits for tasks assessed            Gwynneth Aliment, M.A., CF-SLP Speech Language Pathology, Acute Rehabilitation Services  Secure Chat preferred [phone]  08/13/2023, 4:22 PM

## 2023-08-14 ENCOUNTER — Telehealth: Payer: Self-pay

## 2023-08-14 ENCOUNTER — Telehealth: Payer: Self-pay | Admitting: Family Medicine

## 2023-08-14 ENCOUNTER — Encounter: Payer: Self-pay | Admitting: Neurology

## 2023-08-14 DIAGNOSIS — I4891 Unspecified atrial fibrillation: Secondary | ICD-10-CM

## 2023-08-14 DIAGNOSIS — I152 Hypertension secondary to endocrine disorders: Secondary | ICD-10-CM

## 2023-08-14 MED ORDER — LOSARTAN POTASSIUM-HCTZ 100-12.5 MG PO TABS: 1.0000 | ORAL_TABLET | Freq: Every day | ORAL | 0 refills | Status: DC

## 2023-08-14 MED ORDER — METOPROLOL SUCCINATE ER 50 MG PO TB24: 50.0000 mg | ORAL_TABLET | Freq: Every day | ORAL | 0 refills | Status: DC

## 2023-08-14 NOTE — Telephone Encounter (Signed)
done

## 2023-08-14 NOTE — Telephone Encounter (Signed)
Pt just got out of hospital last night with stroke & he is to follow up 1-2 weeks with PCP & hospital only refilled Eliquis & Crestor.  He needs refills Metoprolol & Losartan to [facility].

## 2023-08-14 NOTE — Telephone Encounter (Signed)
Also pt states Eliquis was $178, I called [facility] with discount card I had on file & brought cost to $10

## 2023-08-14 NOTE — Transitions of Care (Post Inpatient/ED Visit) (Unsigned)
08/14/2023  Name: Stanley Wilkerson MRN: [id number] DOB: 1965-12-16  Today's TOC FU Call Status: Today's TOC FU Call Status:: Unsuccessful Call (1st Attempt) Unsuccessful Call (1st Attempt) Date: 08/14/23  Attempted to reach the patient regarding the most recent Inpatient/ED visit.  Follow Up Plan: Additional outreach attempts will be made to reach the patient to complete the Transitions of Care (Post Inpatient/ED visit) call.   Signature  Karena Addison, LPN [facility] Nurse Health Advisor Direct Dial [phone]

## 2023-08-15 NOTE — Transitions of Care (Post Inpatient/ED Visit) (Signed)
08/15/2023  Name: Stanley Wilkerson MRN: [id number] DOB: 04-28-1966  Today's TOC FU Call Status: Today's TOC FU Call Status:: Successful TOC FU Call Completed Unsuccessful Call (1st Attempt) Date: 08/14/23 [facility] FU Call Complete Date: 08/15/23 Patient's Name and Date of Birth confirmed.  Transition Care Management Follow-up Telephone Call Date of Discharge: 08/13/23 Discharge Facility: [facility]) Type of Discharge: Inpatient Admission Primary Inpatient Discharge Diagnosis:: cerebral infarctin How have you been since you were released from the hospital?: Better Any questions or concerns?: No  Items Reviewed: Did you receive and understand the discharge instructions provided?: Yes Medications obtained,verified, and reconciled?: Yes (Medications Reviewed) Any new allergies since your discharge?: No Dietary orders reviewed?: Yes Do you have support at home?: Yes People in Home: sibling(s)  Medications Reviewed Today: Medications Reviewed Today     Reviewed by Karena Addison, LPN (Licensed Practical Nurse) on 08/15/23 at 1656  Med List Status: <None>   Medication Order Taking? Sig Documenting Provider Last Dose Status Informant  apixaban (ELIQUIS) 5 MG TABS tablet [id number]  Take 1 tablet (5 mg total) by mouth 2 (two) times daily. Lynnae January, NP  Active   losartan-hydrochlorothiazide [facility]) 100-12.5 MG tablet [id number]  Take 1 tablet by mouth daily. Ronnald Nian, MD  Active   metoprolol succinate (TOPROL-XL) 50 MG 24 hr tablet [id number]  Take 1 tablet (50 mg total) by mouth daily. Take with or immediately following a meal.TAKE 1 TABLET BY MOUTH ONCE DAILY. TAKE WITH OR IMMEDIATELY FOLLOWING A MEAL Ronnald Nian, MD  Active   rosuvastatin (CRESTOR) 40 MG tablet [id number]  Take 1 tablet (40 mg total) by mouth daily. Lynnae January, NP  Active             Home Care and Equipment/Supplies: Were Home Health Services Ordered?: NA Any new equipment or medical  supplies ordered?: NA  Functional Questionnaire: Do you need assistance with bathing/showering or dressing?: No Do you need assistance with meal preparation?: No Do you need assistance with eating?: No Do you have difficulty maintaining continence: No Do you need assistance with getting out of bed/getting out of a chair/moving?: No Do you have difficulty managing or taking your medications?: No  Follow up appointments reviewed: PCP Follow-up appointment confirmed?: No (declined) Specialist Hospital Follow-up appointment confirmed?: Yes Date of Specialist follow-up appointment?: 09/11/23 Follow-Up Specialty Provider:: neuro Do you need transportation to your follow-up appointment?: No Do you understand care options if your condition(s) worsen?: Yes-patient verbalized understanding    SIGNATURE Karena Addison, LPN [facility] Nurse Health Advisor Direct Dial [phone]

## 2023-09-11 ENCOUNTER — Inpatient Hospital Stay: Payer: 59 | Admitting: Adult Health

## 2023-09-22 ENCOUNTER — Other Ambulatory Visit: Payer: Self-pay | Admitting: Family Medicine

## 2023-09-22 DIAGNOSIS — I4891 Unspecified atrial fibrillation: Secondary | ICD-10-CM

## 2023-09-22 DIAGNOSIS — E785 Hyperlipidemia, unspecified: Secondary | ICD-10-CM

## 2023-10-17 ENCOUNTER — Telehealth: Payer: Self-pay | Admitting: Family Medicine

## 2023-10-17 ENCOUNTER — Other Ambulatory Visit: Payer: Self-pay | Admitting: Family Medicine

## 2023-10-17 DIAGNOSIS — I4891 Unspecified atrial fibrillation: Secondary | ICD-10-CM

## 2023-10-17 MED ORDER — APIXABAN 5 MG PO TABS: 5.0000 mg | ORAL_TABLET | Freq: Two times a day (BID) | ORAL | 0 refills | Status: DC

## 2023-10-17 MED ORDER — ROSUVASTATIN CALCIUM 40 MG PO TABS: 40.0000 mg | ORAL_TABLET | Freq: Every day | ORAL | 0 refills | Status: DC

## 2023-10-17 NOTE — Telephone Encounter (Signed)
Refills needed for Crestor and Eliquis to [address],  Sched cpe 12/26/2023

## 2023-11-17 ENCOUNTER — Other Ambulatory Visit: Payer: Self-pay

## 2023-11-17 NOTE — Patient Outreach (Signed)
 Telephone outreach to patient to obtain mRS was successfully completed. MRS= 0  Stanley Wilkerson Care Management Assistant [phone]

## 2023-11-18 ENCOUNTER — Other Ambulatory Visit: Payer: Self-pay | Admitting: Family Medicine

## 2023-11-18 DIAGNOSIS — I4891 Unspecified atrial fibrillation: Secondary | ICD-10-CM

## 2023-11-18 DIAGNOSIS — I152 Hypertension secondary to endocrine disorders: Secondary | ICD-10-CM

## 2023-12-26 ENCOUNTER — Ambulatory Visit: Payer: 59 | Admitting: Family Medicine

## 2023-12-26 ENCOUNTER — Encounter: Payer: Self-pay | Admitting: Family Medicine

## 2023-12-26 VITALS — BP 170/100 | HR 90 | Ht 67.0 in | Wt 267.8 lb

## 2023-12-26 DIAGNOSIS — E1169 Type 2 diabetes mellitus with other specified complication: Secondary | ICD-10-CM

## 2023-12-26 DIAGNOSIS — H409 Unspecified glaucoma: Secondary | ICD-10-CM

## 2023-12-26 DIAGNOSIS — E785 Hyperlipidemia, unspecified: Secondary | ICD-10-CM

## 2023-12-26 DIAGNOSIS — I152 Hypertension secondary to endocrine disorders: Secondary | ICD-10-CM

## 2023-12-26 DIAGNOSIS — Z8673 Personal history of transient ischemic attack (TIA), and cerebral infarction without residual deficits: Secondary | ICD-10-CM

## 2023-12-26 DIAGNOSIS — H4010X0 Unspecified open-angle glaucoma, stage unspecified: Secondary | ICD-10-CM | POA: Diagnosis not present

## 2023-12-26 DIAGNOSIS — E118 Type 2 diabetes mellitus with unspecified complications: Secondary | ICD-10-CM | POA: Diagnosis not present

## 2023-12-26 DIAGNOSIS — Z0000 Encounter for general adult medical examination without abnormal findings: Secondary | ICD-10-CM

## 2023-12-26 DIAGNOSIS — I482 Chronic atrial fibrillation, unspecified: Secondary | ICD-10-CM | POA: Diagnosis not present

## 2023-12-26 DIAGNOSIS — Z9841 Cataract extraction status, right eye: Secondary | ICD-10-CM

## 2023-12-26 DIAGNOSIS — I517 Cardiomegaly: Secondary | ICD-10-CM

## 2023-12-26 DIAGNOSIS — E1159 Type 2 diabetes mellitus with other circulatory complications: Secondary | ICD-10-CM

## 2023-12-26 DIAGNOSIS — E1121 Type 2 diabetes mellitus with diabetic nephropathy: Secondary | ICD-10-CM | POA: Diagnosis not present

## 2023-12-26 LAB — POCT UA - MICROALBUMIN
Albumin/Creatinine Ratio, Urine, POC: 175.5
Creatinine, POC: 123 mg/dL
Microalbumin Ur, POC: 215.9 mg/L

## 2023-12-26 LAB — POCT GLYCOSYLATED HEMOGLOBIN (HGB A1C): Hemoglobin A1C: 6.9 % — AB (ref 4.0–5.6)

## 2023-12-26 LAB — LIPID PANEL

## 2023-12-26 MED ORDER — ROSUVASTATIN CALCIUM 40 MG PO TABS: 40.0000 mg | ORAL_TABLET | Freq: Every day | ORAL | 3 refills | Status: DC

## 2023-12-26 MED ORDER — APIXABAN 5 MG PO TABS
5.0000 mg | ORAL_TABLET | Freq: Two times a day (BID) | ORAL | 1 refills | Status: DC
Start: 2023-12-26 — End: 2024-09-28

## 2023-12-26 MED ORDER — METOPROLOL SUCCINATE ER 50 MG PO TB24
50.0000 mg | ORAL_TABLET | Freq: Every day | ORAL | 3 refills | Status: DC
Start: 2023-12-26 — End: 2024-01-30

## 2023-12-26 MED ORDER — LOSARTAN POTASSIUM-HCTZ 100-12.5 MG PO TABS: 1.0000 | ORAL_TABLET | Freq: Every day | ORAL | 3 refills | Status: DC

## 2023-12-26 NOTE — Progress Notes (Signed)
Complete physical exam  Patient: Stanley Wilkerson   DOB: 15-Apr-1966   58 y.o. Male  MRN: [id number]  Subjective:    Chief Complaint  Patient presents with   Annual Exam    Annual exam. Has been tingling in is right upper that started Nov/Dec and left hand since December. Right leg pain when he starts walking. Left great toenail is bothering him. Chart is calling for Microalbumin, do you want?    Stanley Wilkerson is a 58 y.o. male who presents today for a complete physical exam.  He reports consuming a general diet.  None.  He generally feels fairly well. He reports sleeping fairly well.  He does complain of some right shoulder numbness however getting further information concerning this was very difficult.  I cannot relate this to any particular motions, numbness tingling or weakness.  He also complains of left index finger numbness and decreased grip strength and relates this to a previous history of CVA.  He has not gotten follow-up on either of these things and constantly the fact that all of this cost him money and he sees no benefit in seeing anybody about it.  He has also had some right leg and back discomfort, did see an orthopedic surgeon apparently x-rays were negative and he was sent for physical therapy and again he did not go because he did not want to spend the money.  He has also had cataract surgery and evidence of glaucoma.  He complains of difficulty with vision of the right eye having only central vision.  He has not gotten follow-up on that again because he does not want to spend the money. Further questioning of him indicates that he has not been taking any of his medications on a regular basis. Most recent fall risk assessment:    12/26/2023    9:52 AM  Fall Risk   Falls in the past year? 0  Number falls in past yr: 0  Injury with Fall? 0  Risk for fall due to : No Fall Risks  Follow up Falls evaluation completed     Most recent depression screenings:     12/26/2023    9:52 AM 06/16/2022    8:41 AM  PHQ 2/9 Scores  PHQ - 2 Score 0 0    Vision:Not within last year     Immunization History  Administered Date(s) Administered   Tdap 02/07/2019    Health Maintenance  Topic Date Due   Pneumococcal Vaccine 53-35 Years old (1 of 2 - PCV) Never done   OPHTHALMOLOGY EXAM  Never done   Zoster Vaccines- Shingrix (1 of 2) Never done   Fecal DNA (Cologuard)  05/16/2023   Diabetic kidney evaluation - Urine ACR  06/17/2023   FOOT EXAM  06/17/2023   INFLUENZA VACCINE  Never done   HEMOGLOBIN A1C  02/08/2024   Diabetic kidney evaluation - eGFR measurement  08/12/2024   DTaP/Tdap/Td (2 - Td or Tdap) 02/06/2029   Hepatitis C Screening  Completed   HIV Screening  Completed   HPV VACCINES  Aged Out   Colonoscopy  Discontinued   COVID-19 Vaccine  Discontinued    Patient Care Team: Ronnald Nian, MD as PCP - General (Family Medicine)  Optho: Dr. Wynelle Link [id number]   Outpatient Medications Prior to Visit  Medication Sig Note   metoprolol succinate (TOPROL-XL) 50 MG 24 hr tablet TAKE 1 TABLET BY MOUTH ONCE DAILY WITH MEALS OR  IMMEDIATELY  FOLLOWING  A  MEAL . APPOINTMENT REQUIRED FOR FUTURE REFILLS    rosuvastatin (CRESTOR) 40 MG tablet Take 1 tablet (40 mg total) by mouth daily.    apixaban (ELIQUIS) 5 MG TABS tablet Take 1 tablet (5 mg total) by mouth 2 (two) times daily. (Patient not taking: Reported on 12/26/2023) 12/26/2023: Did not take today   losartan-hydrochlorothiazide (HYZAAR) 100-12.5 MG tablet TAKE 1 TABLET BY MOUTH ONCE DAILY . APPOINTMENT REQUIRED FOR FUTURE REFILLS (Patient not taking: Reported on 12/26/2023) 12/26/2023: Did not take today   No facility-administered medications prior to visit.    Review of Systems  All other systems reviewed and are negative.   Family and social history as well as health maintenance and immunizations was reviewed.     Objective:    BP (!) 170/100   Pulse 90   Ht 5\' 7"  (1.702 m)   Wt 267 lb  12.8 oz (121.5 kg)   SpO2 97%   BMI 41.94 kg/m    Physical Exam  Alert and in no distress. Tympanic membranes and canals are normal. Pharyngeal area is normal. Neck is supple without adenopathy or thyromegaly. Cardiac exam shows an irregular rhythm without murmurs or gallops. Lungs are clear to auscultation. Hemoglobin A1c is 6.9     Assessment & Plan:      Routine general medical examination at a health care facility  Chronic atrial fibrillation (HCC)  Diabetic nephropathy associated with type 2 diabetes mellitus (HCC)  Open-angle glaucoma, unspecified glaucoma stage, unspecified laterality, unspecified open-angle glaucoma type  Hyperlipidemia associated with type 2 diabetes mellitus (HCC)  Hypertension associated with diabetes (HCC)  History of CVA (cerebrovascular accident)  LVH (left ventricular hypertrophy)  Morbid obesity (HCC)  Controlled type 2 diabetes mellitus with complication, without long-term current use of insulin (HCC)  At this point his diabetes seems to be under good control.  I again explained the fact that he needs to stay on his medications regularly and mention follow-up with various specialist but at this point he is unwilling to see anybody else mainly due to cost.  I will have him come back here in 1 month for recheck on his blood pressure and hopefully at that point he will take all of his medications regularly.  He also refused any immunizations.     Sharlot Gowda, MD

## 2023-12-27 LAB — CBC WITH DIFFERENTIAL/PLATELET
Basophils Absolute: 0 10*3/uL (ref 0.0–0.2)
Basos: 1 %
EOS (ABSOLUTE): 0.2 10*3/uL (ref 0.0–0.4)
Eos: 3 %
Hematocrit: 43.3 % (ref 37.5–51.0)
Hemoglobin: 15 g/dL (ref 13.0–17.7)
Immature Grans (Abs): 0 10*3/uL (ref 0.0–0.1)
Immature Granulocytes: 0 %
Lymphocytes Absolute: 1.5 10*3/uL (ref 0.7–3.1)
Lymphs: 24 %
MCH: 30.2 pg (ref 26.6–33.0)
MCHC: 34.6 g/dL (ref 31.5–35.7)
MCV: 87 fL (ref 79–97)
Monocytes Absolute: 0.5 10*3/uL (ref 0.1–0.9)
Monocytes: 8 %
Neutrophils Absolute: 4.1 10*3/uL (ref 1.4–7.0)
Neutrophils: 64 %
Platelets: 280 10*3/uL (ref 150–450)
RBC: 4.97 x10E6/uL (ref 4.14–5.80)
RDW: 13.2 % (ref 11.6–15.4)
WBC: 6.4 10*3/uL (ref 3.4–10.8)

## 2023-12-27 LAB — COMPREHENSIVE METABOLIC PANEL
ALT: 17 IU/L (ref 0–44)
AST: 19 IU/L (ref 0–40)
Albumin: 4.2 g/dL (ref 3.8–4.9)
Alkaline Phosphatase: 84 IU/L (ref 44–121)
BUN/Creatinine Ratio: 17 (ref 9–20)
BUN: 15 mg/dL (ref 6–24)
Bilirubin Total: 0.9 mg/dL (ref 0.0–1.2)
CO2: 26 mmol/L (ref 20–29)
Calcium: 9.1 mg/dL (ref 8.7–10.2)
Chloride: 99 mmol/L (ref 96–106)
Creatinine, Ser: 0.9 mg/dL (ref 0.76–1.27)
Globulin, Total: 2.8 g/dL (ref 1.5–4.5)
Glucose: 127 mg/dL — ABNORMAL HIGH (ref 70–99)
Potassium: 3.7 mmol/L (ref 3.5–5.2)
Sodium: 140 mmol/L (ref 134–144)
Total Protein: 7 g/dL (ref 6.0–8.5)
eGFR: 100 mL/min/{1.73_m2} (ref >59)

## 2023-12-27 LAB — LIPID PANEL
Cholesterol, Total: 222 mg/dL — ABNORMAL HIGH (ref 100–199)
HDL: 47 mg/dL (ref >39)
LDL CALC COMMENT:: 4.7 ratio (ref 0.0–5.0)
LDL Chol Calc (NIH): 160 mg/dL — ABNORMAL HIGH (ref 0–99)
Triglycerides: 82 mg/dL (ref 0–149)
VLDL Cholesterol Cal: 15 mg/dL (ref 5–40)

## 2024-01-30 ENCOUNTER — Ambulatory Visit: Payer: 59 | Admitting: Family Medicine

## 2024-01-30 VITALS — BP 166/96 | HR 93 | Ht 67.25 in | Wt 272.6 lb

## 2024-01-30 DIAGNOSIS — I152 Hypertension secondary to endocrine disorders: Secondary | ICD-10-CM

## 2024-01-30 DIAGNOSIS — E1159 Type 2 diabetes mellitus with other circulatory complications: Secondary | ICD-10-CM

## 2024-01-30 MED ORDER — METOPROLOL SUCCINATE ER 100 MG PO TB24: 100.0000 mg | ORAL_TABLET | Freq: Every day | ORAL | 3 refills | Status: DC

## 2024-01-30 NOTE — Progress Notes (Signed)
   Subjective:    Patient ID: Stanley Wilkerson, male    DOB: [birth year], 58 y.o.   MRN: [id number]  HPI He is here for a blood pressure check.  I reviewed medications with him and he is taking Eliquis usually once per day.  He states that he is taking all of his other medicines regularly.   Review of Systems     Objective:    Physical Exam Alert and in no distress otherwise not examined.  Blood pressure is recorded.       Assessment & Plan:  Hypertension associated with diabetes (HCC) - Plan: metoprolol succinate (TOPROL-XL) 100 MG 24 hr tablet Cautioned him to take the Eliquis twice per day.  Discussed possibly switching him to Xarelto but at this point he is going to stick with Eliquis. I will double his Toprol and recheck in 1 month.  Might need to place him on another medicine.

## 2024-01-30 NOTE — Patient Instructions (Signed)
 Take 2 of your present metoprolol pills until they are gone and then start the new 1 which will be double the dosing of the 1 you are on now

## 2024-03-19 ENCOUNTER — Telehealth: Payer: Self-pay | Admitting: Family Medicine

## 2024-03-19 NOTE — Telephone Encounter (Signed)
 Pt called & states Eliquis  [facility] pay card not working, called pharmacy & card rejecting.  Called Eliquis  [facility] pay assist [phone] & issue was that card expired,  they reactivated card & it's good for 2 years.  Called pharmacy back & had them reprocess & went thru for $10.  Pt informed

## 2024-06-25 NOTE — Procedures (Signed)
 SABRA

## 2024-09-27 ENCOUNTER — Emergency Department (HOSPITAL_BASED_OUTPATIENT_CLINIC_OR_DEPARTMENT_OTHER)

## 2024-09-27 ENCOUNTER — Encounter (HOSPITAL_BASED_OUTPATIENT_CLINIC_OR_DEPARTMENT_OTHER): Payer: Self-pay | Admitting: Emergency Medicine

## 2024-09-27 ENCOUNTER — Observation Stay (HOSPITAL_BASED_OUTPATIENT_CLINIC_OR_DEPARTMENT_OTHER)
Admission: EM | Admit: 2024-09-27 | Discharge: 2024-09-28 | Disposition: A | Discharge status: Home / Self Care | Attending: Emergency Medicine | Admitting: Emergency Medicine

## 2024-09-27 ENCOUNTER — Other Ambulatory Visit: Payer: Self-pay

## 2024-09-27 DIAGNOSIS — C641 Malignant neoplasm of right kidney, except renal pelvis: Secondary | ICD-10-CM | POA: Diagnosis not present

## 2024-09-27 DIAGNOSIS — I482 Chronic atrial fibrillation, unspecified: Secondary | ICD-10-CM | POA: Diagnosis not present

## 2024-09-27 DIAGNOSIS — E785 Hyperlipidemia, unspecified: Secondary | ICD-10-CM | POA: Diagnosis not present

## 2024-09-27 DIAGNOSIS — Z6841 Body Mass Index (BMI) 40.0 and over, adult: Secondary | ICD-10-CM | POA: Insufficient documentation

## 2024-09-27 DIAGNOSIS — N280 Ischemia and infarction of kidney: Secondary | ICD-10-CM | POA: Diagnosis present

## 2024-09-27 DIAGNOSIS — Z87891 Personal history of nicotine dependence: Secondary | ICD-10-CM | POA: Diagnosis not present

## 2024-09-27 DIAGNOSIS — Z743 Need for continuous supervision: Secondary | ICD-10-CM | POA: Diagnosis not present

## 2024-09-27 DIAGNOSIS — E1165 Type 2 diabetes mellitus with hyperglycemia: Secondary | ICD-10-CM

## 2024-09-27 DIAGNOSIS — Z7901 Long term (current) use of anticoagulants: Secondary | ICD-10-CM | POA: Insufficient documentation

## 2024-09-27 DIAGNOSIS — I4891 Unspecified atrial fibrillation: Secondary | ICD-10-CM | POA: Diagnosis present

## 2024-09-27 DIAGNOSIS — Z91148 Patient's other noncompliance with medication regimen for other reason: Secondary | ICD-10-CM | POA: Diagnosis not present

## 2024-09-27 DIAGNOSIS — Z79899 Other long term (current) drug therapy: Secondary | ICD-10-CM | POA: Insufficient documentation

## 2024-09-27 DIAGNOSIS — I160 Hypertensive urgency: Secondary | ICD-10-CM | POA: Diagnosis not present

## 2024-09-27 DIAGNOSIS — R1084 Generalized abdominal pain: Secondary | ICD-10-CM | POA: Diagnosis not present

## 2024-09-27 DIAGNOSIS — I10 Essential (primary) hypertension: Secondary | ICD-10-CM | POA: Diagnosis not present

## 2024-09-27 DIAGNOSIS — E66812 Obesity, class 2: Secondary | ICD-10-CM | POA: Diagnosis not present

## 2024-09-27 DIAGNOSIS — N289 Disorder of kidney and ureter, unspecified: Secondary | ICD-10-CM

## 2024-09-27 DIAGNOSIS — K869 Disease of pancreas, unspecified: Secondary | ICD-10-CM | POA: Diagnosis not present

## 2024-09-27 DIAGNOSIS — K8689 Other specified diseases of pancreas: Secondary | ICD-10-CM | POA: Diagnosis not present

## 2024-09-27 DIAGNOSIS — R10A1 Flank pain, right side: Secondary | ICD-10-CM | POA: Diagnosis present

## 2024-09-27 LAB — COMPREHENSIVE METABOLIC PANEL WITH GFR
ALT: 41 U/L (ref 0–44)
AST: 40 U/L (ref 15–41)
Albumin: 4.3 g/dL (ref 3.5–5.0)
Alkaline Phosphatase: 91 U/L (ref 38–126)
Anion gap: 9 (ref 5–15)
BUN: 19 mg/dL (ref 6–20)
CO2: 30 mmol/L (ref 22–32)
Calcium: 9.4 mg/dL (ref 8.9–10.3)
Chloride: 96 mmol/L — ABNORMAL LOW (ref 98–111)
Creatinine, Ser: 1.12 mg/dL (ref 0.61–1.24)
GFR, Estimated: >60 mL/min (ref >60)
Glucose, Bld: 181 mg/dL — ABNORMAL HIGH (ref 70–99)
Potassium: 3.8 mmol/L (ref 3.5–5.1)
Sodium: 136 mmol/L (ref 135–145)
Total Bilirubin: 1.4 mg/dL — ABNORMAL HIGH (ref 0.0–1.2)
Total Protein: 7.6 g/dL (ref 6.5–8.1)

## 2024-09-27 LAB — URINALYSIS, ROUTINE W REFLEX MICROSCOPIC
Bacteria, UA: NONE SEEN
Bilirubin Urine: NEGATIVE
Glucose, UA: 500 mg/dL — AB
Leukocytes,Ua: NEGATIVE
Nitrite: NEGATIVE
Protein, ur: 100 mg/dL — AB
Specific Gravity, Urine: 1.025 (ref 1.005–1.030)
pH: 6.5 (ref 5.0–8.0)

## 2024-09-27 LAB — GLUCOSE, CAPILLARY: Glucose-Capillary: 167 mg/dL — ABNORMAL HIGH (ref 70–99)

## 2024-09-27 LAB — CBC
HCT: 42 % (ref 39.0–52.0)
Hemoglobin: 14.4 g/dL (ref 13.0–17.0)
MCH: 29.8 pg (ref 26.0–34.0)
MCHC: 34.3 g/dL (ref 30.0–36.0)
MCV: 87 fL (ref 80.0–100.0)
Platelets: 281 K/uL (ref 150–400)
RBC: 4.83 MIL/uL (ref 4.22–5.81)
RDW: 13.3 % (ref 11.5–15.5)
WBC: 9.8 K/uL (ref 4.0–10.5)
nRBC: 0 % (ref 0.0–0.2)

## 2024-09-27 LAB — LIPASE, BLOOD: Lipase: 27 U/L (ref 11–51)

## 2024-09-27 LAB — HEPARIN LEVEL (UNFRACTIONATED): Heparin Unfractionated: 0.13 [IU]/mL — ABNORMAL LOW (ref 0.30–0.70)

## 2024-09-27 MED ORDER — INSULIN ASPART 100 UNIT/ML IJ SOLN: 0.0000 [IU] | Freq: Every day | INTRAMUSCULAR | Status: DC

## 2024-09-27 MED ORDER — LOSARTAN POTASSIUM 50 MG PO TABS
100.0000 mg | ORAL_TABLET | Freq: Every day | ORAL | Status: DC
  Administered 2024-09-27 – 2024-09-28 (×2): 100 mg via ORAL
  Filled 2024-09-27 (×2): qty 2

## 2024-09-27 MED ORDER — MORPHINE SULFATE (PF) 2 MG/ML IV SOLN
2.0000 mg | Freq: Once | INTRAVENOUS | Status: AC
  Administered 2024-09-27: 2 mg via INTRAVENOUS
  Filled 2024-09-27: qty 1

## 2024-09-27 MED ORDER — HYDROMORPHONE HCL 1 MG/ML IJ SOLN
0.5000 mg | INTRAMUSCULAR | Status: DC | PRN
  Administered 2024-09-27 – 2024-09-28 (×3): 0.5 mg via INTRAVENOUS
  Filled 2024-09-27 (×3): qty 0.5

## 2024-09-27 MED ORDER — KETOROLAC TROMETHAMINE 15 MG/ML IJ SOLN
15.0000 mg | Freq: Once | INTRAMUSCULAR | Status: AC
  Administered 2024-09-27: 15 mg via INTRAVENOUS
  Filled 2024-09-27: qty 1

## 2024-09-27 MED ORDER — HEPARIN BOLUS VIA INFUSION
3000.0000 [IU] | Freq: Once | INTRAVENOUS | Status: AC
Start: 2024-09-27 — End: 2024-09-27
  Administered 2024-09-27: 3000 [IU] via INTRAVENOUS
  Filled 2024-09-27: qty 3000

## 2024-09-27 MED ORDER — LORAZEPAM 2 MG/ML IJ SOLN
0.5000 mg | INTRAMUSCULAR | Status: AC
  Administered 2024-09-27: 0.5 mg via INTRAVENOUS
  Filled 2024-09-27: qty 1

## 2024-09-27 MED ORDER — ONDANSETRON HCL 4 MG PO TABS: 4.0000 mg | ORAL_TABLET | Freq: Four times a day (QID) | ORAL | Status: DC | PRN

## 2024-09-27 MED ORDER — HEPARIN (PORCINE) 25000 UT/250ML-% IV SOLN
2000.0000 [IU]/h | INTRAVENOUS | Status: DC
  Administered 2024-09-27: 1500 [IU]/h via INTRAVENOUS
  Administered 2024-09-28: 1800 [IU]/h via INTRAVENOUS
  Filled 2024-09-27 (×2): qty 250

## 2024-09-27 MED ORDER — FENTANYL CITRATE (PF) 50 MCG/ML IJ SOSY
25.0000 ug | PREFILLED_SYRINGE | Freq: Once | INTRAMUSCULAR | Status: AC
  Administered 2024-09-27: 25 ug via INTRAVENOUS
  Filled 2024-09-27: qty 1

## 2024-09-27 MED ORDER — SENNOSIDES-DOCUSATE SODIUM 8.6-50 MG PO TABS: 1.0000 | ORAL_TABLET | Freq: Every evening | ORAL | Status: DC | PRN

## 2024-09-27 MED ORDER — ACETAMINOPHEN 325 MG PO TABS
650.0000 mg | ORAL_TABLET | Freq: Four times a day (QID) | ORAL | Status: DC | PRN
  Administered 2024-09-27: 650 mg via ORAL
  Filled 2024-09-27: qty 2

## 2024-09-27 MED ORDER — METOPROLOL SUCCINATE ER 100 MG PO TB24
100.0000 mg | ORAL_TABLET | Freq: Every day | ORAL | Status: DC
  Administered 2024-09-27 – 2024-09-28 (×2): 100 mg via ORAL
  Filled 2024-09-27 (×2): qty 1

## 2024-09-27 MED ORDER — INSULIN ASPART 100 UNIT/ML IJ SOLN
0.0000 [IU] | Freq: Three times a day (TID) | INTRAMUSCULAR | Status: DC
  Administered 2024-09-28: 5 [IU] via SUBCUTANEOUS
  Administered 2024-09-28: 3 [IU] via SUBCUTANEOUS

## 2024-09-27 MED ORDER — ACETAMINOPHEN 650 MG RE SUPP: 650.0000 mg | Freq: Four times a day (QID) | RECTAL | Status: DC | PRN

## 2024-09-27 MED ORDER — BISACODYL 5 MG PO TBEC: 5.0000 mg | DELAYED_RELEASE_TABLET | Freq: Every day | ORAL | Status: DC | PRN

## 2024-09-27 MED ORDER — LOSARTAN POTASSIUM-HCTZ 100-12.5 MG PO TABS: 1.0000 | ORAL_TABLET | Freq: Every day | ORAL | Status: DC

## 2024-09-27 MED ORDER — HEPARIN BOLUS VIA INFUSION
5500.0000 [IU] | Freq: Once | INTRAVENOUS | Status: AC
  Administered 2024-09-27: 5500 [IU] via INTRAVENOUS

## 2024-09-27 MED ORDER — GADOBUTROL 1 MMOL/ML IV SOLN
10.0000 mL | Freq: Once | INTRAVENOUS | Status: AC | PRN
  Administered 2024-09-27: 10 mL via INTRAVENOUS

## 2024-09-27 MED ORDER — HYDROCHLOROTHIAZIDE 12.5 MG PO TABS
12.5000 mg | ORAL_TABLET | Freq: Every day | ORAL | Status: DC
  Administered 2024-09-28: 12.5 mg via ORAL
  Filled 2024-09-27: qty 1

## 2024-09-27 MED ORDER — ONDANSETRON HCL 4 MG/2ML IJ SOLN: 4.0000 mg | Freq: Four times a day (QID) | INTRAMUSCULAR | Status: DC | PRN

## 2024-09-27 NOTE — H&P (Signed)
 History and Physical  Stanley Wilkerson FMW:[id number] DOB: 07/15/1966 DOA: 09/27/2024  PCP: Joyce Norleen BROCKS, MD   Chief Complaint: Right flank pain  HPI: Stanley Wilkerson is a 58 y.o. male with medical history significant for A-fib on Eliquis  (noncompliant), HTN, HLD, CVA, obesity, and T2DM who presented to [facility] for evaluation of right flank pain. Patient reports that last night, he started having severe pain in his right flank described as sharp in nature.  He reports associated mild nausea this morning but no fevers, chills, dysuria, hematuria, chest pain, shortness of breath, palpitations or abdominal pain.  Patient reports he thought he had a kidney stone.  Reports he was on Eliquis  for A-fib however he stopped taking it 3 to 4 months ago due to concern about his increased risk of bleeding.  States his mother had a significant bleed while on oral anticoagulation.   [facility] ED Course: Initial vitals show afebrile, HR 70-90, SBP 140-190s, SpO2 98% on room air. Initial labs significant for glucose 181, otherwise normal renal function, LFTs and CBC, UA shows significant glucosuria, trace hemoglobinuria and ketonuria but no signs of infection. EKG shows A-fib with LVH.  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 Pt received IV fentanyl, IV Ativan , IV morphine and started on heparin  drip.  Urology was consulted for evaluation.  Patient admitted to [facility] service and transferred to [facility].  Review of Systems: Please see HPI for pertinent positives and negatives. A complete 10 system review of systems are otherwise negative.  Past Medical History:  Diagnosis Date   Hypertension    Obesity    Past Surgical History:  Procedure Laterality Date   CATARACT EXTRACTION Right 07/23/2020   Social History:  reports that he has quit smoking. He has never used smokeless tobacco. He reports  that he does not drink alcohol and does not use drugs.  Allergies  Allergen Reactions   Penicillins Other (See Comments)    Unknown reaction during childhood    Family History  Problem Relation Age of Onset   Heart disease Father      Prior to Admission medications   Medication Sig Start Date End Date Taking? Authorizing Provider  apixaban  (ELIQUIS ) 5 MG TABS tablet Take 1 tablet (5 mg total) by mouth 2 (two) times daily. 12/26/23   Joyce Norleen BROCKS, MD  losartan -hydrochlorothiazide  (HYZAAR) 100-12.5 MG tablet Take 1 tablet by mouth daily. 12/26/23   Joyce Norleen BROCKS, MD  metoprolol  succinate (TOPROL -XL) 100 MG 24 hr tablet Take 1 tablet (100 mg total) by mouth daily. Take with or immediately following a meal. 01/30/24   Joyce Norleen BROCKS, MD  rosuvastatin  (CRESTOR ) 40 MG tablet Take 1 tablet (40 mg total) by mouth daily. 12/26/23   Joyce Norleen BROCKS, MD    Physical Exam: BP (!) 174/97 (BP Location: Left Arm)   Pulse 99   Temp 99.4 F (37.4 C) (Oral)   Resp (!) 21   Ht 5' 7 (1.702 m)   Wt 122.5 kg   SpO2 100%   BMI 42.29 kg/m  General: Pleasant, well-appearing obese middle-age man sitting in chair. No acute distress. HEENT: [place]/AT. Anicteric sclera CV: Regular rate. Irregularly irregular rhythm. No murmurs, rubs, or gallops. No LE edema Pulmonary: Lungs CTAB. Normal effort. No wheezing or rales. Abdominal: Soft, nontender, nondistended. Mild right flank tenderness. Normal bowel sounds. Extremities: Palpable radial and DP pulses. Normal  ROM. Skin: Warm and dry. No obvious rash or lesions. Neuro: A&Ox3. Moves all extremities. Normal sensation to light touch. No focal deficit. Psych: Normal mood and affect          Labs on Admission:  Basic Metabolic Panel: Recent Labs  Lab 09/27/24 0858  NA 136  K 3.8  CL 96*  CO2 30  GLUCOSE 181*  BUN 19  CREATININE 1.12  CALCIUM  9.4   Liver Function Tests: Recent Labs  Lab 09/27/24 0858  AST 40  ALT 41  ALKPHOS 91  BILITOT 1.4*   PROT 7.6  ALBUMIN 4.3   Recent Labs  Lab 09/27/24 0858  LIPASE 27   No results for input(s): AMMONIA in the last 168 hours. CBC: Recent Labs  Lab 09/27/24 0858  WBC 9.8  HGB 14.4  HCT 42.0  MCV 87.0  PLT 281   Cardiac Enzymes: No results for input(s): CKTOTAL, CKMB, CKMBINDEX, TROPONINI in the last 168 hours. BNP (last 3 results) No results for input(s): BNP in the last 8760 hours.  ProBNP (last 3 results) No results for input(s): PROBNP in the last 8760 hours.  CBG: No results for input(s): GLUCAP in the last 168 hours.  Radiological Exams on Admission: MR Abdomen W or Wo Contrast Result Date: 09/27/2024 CLINICAL DATA:  Right-sided abdominal pain. Left lower pole renal lesion on same day CT EXAM: MRI ABDOMEN WITHOUT AND WITH CONTRAST TECHNIQUE: Multiplanar multisequence MR imaging of the abdomen was performed both before and after the administration of intravenous contrast. CONTRAST:  10mL GADAVIST GADOBUTROL 1 MMOL/ML IV SOLN COMPARISON:  Same day CT abdomen and pelvis FINDINGS: Lower chest: No acute findings. Hepatobiliary: Nonenhancing 10 mm segment 2 cyst (19:18). No bile duct dilation. Normal gallbladder. Pancreas: T2 hyperintense cystic foci within the pancreatic body and tail measuring up to 4 mm (3:18, 23). No main ductal dilation, mass lesion, or abnormal enhancement. Spleen: Nonenhancing cyst in the anterior spleen measures 5.2 x 4.3 cm (15:22) and contains a thin internal septation at the inferior aspect (21:32). Adrenals/Urinary Tract: No adrenal nodules. Nonenhancement of the anterior right kidney demonstrates diffusion restriction and sharp demarcation with the normally enhancing posterior kidney. The right renal artery enhances normally to the level of the bifurcation at the renal hilum (15:61) where the anterior branch demonstrates non enhancement. Bilateral simple cysts. T2 isointense, T1 mildly hyperintense exophytic anterior left lower pole  lesion measuring 1.1 cm (15:72) demonstrates subtle hypoenhancement. Stomach/Bowel: Visualized portions within the abdomen are unremarkable. Normal appendix (3:25) when correlated with same day CT. Vascular/Lymphatic: No pathologically enlarged lymph nodes identified. No abdominal aortic aneurysm demonstrated. Other:  None. Musculoskeletal: No suspicious bone lesions identified. Partially imaged right bladder herniation into the inguinal canal (3:21), better evaluated on same day CT. IMPRESSION: 1. Right anterior renal infarction. 2. Exophytic anterior left lower pole lesion measuring 1.1 cm demonstrates subtle hypoenhancement, suspicious for papillary renal cell carcinoma. 3. 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 No main ductal dilation, mass lesion, or abnormal enhancement. Recommend follow-up pre and post contrast MRI/MRCP in 2 years. 4. Partially imaged right bladder herniation into the inguinal canal, better evaluated on same day CT. Normal appendix. Electronically Signed   By: Limin  Xu M.D.   On: 09/27/2024 14:14   CT ABDOMEN PELVIS WO CONTRAST Result Date: 09/27/2024 EXAM: CT ABDOMEN AND PELVIS WITHOUT CONTRAST 09/27/2024 09:15:53 AM TECHNIQUE: CT of the abdomen and pelvis was performed without the administration of  intravenous contrast. Multiplanar reformatted images are provided for review. Automated exposure control, iterative reconstruction, and/or weight-based adjustment of the mA/kV was utilized to reduce the radiation dose to as low as reasonably achievable. COMPARISON: None available. CLINICAL HISTORY: Abdominal pain, acute, nonlocalized. FINDINGS: LOWER CHEST: No acute abnormality. LIVER: The liver is unremarkable. GALLBLADDER AND BILE DUCTS: Gallbladder is unremarkable. No biliary ductal dilatation. SPLEEN: 5.3 cm splenic low density most consistent with hemangioma or cyst. PANCREAS: No acute  abnormality. ADRENAL GLANDS: No acute abnormality. KIDNEYS, URETERS AND BLADDER: 13 mm exophytic hyperdense abnormality arising from lower pole of left kidney most consistent with hyperdense cysts but renal ultrasound is recommended for confirmation and to rule out neoplasm. Simple right renal cyst is noted. Per consensus, no follow-up is needed for simple Bosniak type 1 and 2 renal cysts, unless the patient has a malignancy history or risk factors. Large fat-containing right inguinal hernia is noted. This appears to also contain a portion of the urinary bladder proximally. Small fat-containing left inguinal hernia is noted. No stones in the kidneys or ureters. No hydronephrosis. No perinephric or periureteral stranding. GI AND BOWEL: Stomach demonstrates no acute abnormality. There is no bowel obstruction. PERITONEUM AND RETROPERITONEUM: No ascites. No free air. VASCULATURE: Aortic atherosclerosis. Aorta is normal in caliber. LYMPH NODES: No lymphadenopathy. REPRODUCTIVE ORGANS: No acute abnormality. BONES AND SOFT TISSUES: No acute osseous abnormality. No focal soft tissue abnormality. IMPRESSION: 1. 13 mm exophytic hyperdense lesion arising from the lower pole of the left kidney, indeterminate; recommend renal ultrasound to evaluate for hyperdense cyst versus mass. 2. Large predominantly fat-containing right inguinal hernia containing a portion of the urinary bladder proximally. 3. Small fat-containing left inguinal hernia. Electronically signed by: Lynwood Seip MD 09/27/2024 09:45 AM EDT RP Workstation: [id number]   Assessment/Plan Stanley Wilkerson is a 58 y.o. male with medical history significant for A-fib on Eliquis  (noncompliant), HTN, HLD, CVA, obesity, and T2DM who presented to [facility] for evaluation of right flank pain and admitted for acute renal infarct.  # Right renal infarct - Patient with a history of A-fib noncompliant with anticoagulation now presenting with severe right flank  pain - MRI of the abdomen shows a right anterior renal infarct - Renal infarct likely from lack of anticoagulation in the setting of persistent A-fib - Will continue IV heparin  for now - Pain control with IV Dilaudid - Urology consulted by EDP, does not recommend any further urology follow-up  # Chronic A-fib - Reports long history of A-fib previously on Eliquis  but has been noncompliant over the last 3 to 4 months - Remains in A-fib but rate controlled - Had an extensive discussion with patient about the importance of anticoagulation with his chronic A-fib to decrease his risk of thromboembolic events - Continue IV heparin  for now transition to Eliquis  prior to discharge - Patient agreeable to resuming anticoagulation moving forward  # Left renal lesion - MRI of the abdomen shows exophytic anterior left lower pole lesion measuring 1.1 cm demonstrates subtle hypoenhancement, suspicious for papillary renal cell carcinoma. 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  Reports he has not taking his BP meds today - Continue losartan , HCTZ  and metoprolol   # T2DM - A1c 6.9% 5 months ago, glucose 181 on admission - SSI with meals, CBG monitoring - Follow-up repeat A1c  # HLD - C prescribe rosuvastatin  but has been noncompliant with this as well - Check lipid panel  # Class III obesity Body mass index is 42.29 kg/m. Filed Weights   09/27/24 0834  Weight: 122.5 kg  - F/u with PCP for weight lost and nutrition counseling   DVT prophylaxis: Heparin     Code Status: Full Code  Consults called: Urology  Family Communication: No family at  bedside  Severity of Illness: The appropriate patient status for this patient is OBSERVATION. Observation status is judged to be reasonable and necessary in order to provide the required intensity of service to ensure the patient's safety. The patient's presenting symptoms, physical exam findings, and initial radiographic and laboratory data in the context of their medical condition is felt to place them at decreased risk for further clinical deterioration. Furthermore, it is anticipated that the patient will be medically stable for discharge from the hospital within 2 midnights of admission.   Level of care: Telemetry    Lou Claretta HERO, MD 09/27/2024, 8:53 PM [facility] Pager: [phone] Isaiah 41:10   If 7PM-7AM, please contact night-coverage [email or web address] Password [email or web address]

## 2024-09-27 NOTE — ED Notes (Signed)
 Called Karen at [facility] for transport

## 2024-09-27 NOTE — ED Notes (Signed)
 Called Monisha at [facility] for hosp consult

## 2024-09-27 NOTE — Progress Notes (Addendum)
 PHARMACY - ANTICOAGULATION CONSULT NOTE  Pharmacy Consult for heparin  Indication: atrial fibrillation and renal infarct  Allergies  Allergen Reactions   Penicillins Other (See Comments)    Unknown reaction during childhood    Patient Measurements: Height: 5' 7 (170.2 cm) Weight: 122.5 kg (270 lb) IBW/kg (Calculated) : 66.1 HEPARIN  DW (KG): 94.6  Vital Signs: Temp: 99 F (37.2 C) (10/31 2100) Temp Source: Oral (10/31 2100) BP: 180/100 (10/31 2129) Pulse Rate: 89 (10/31 2129)  Labs: Recent Labs    09/27/24 0858 09/27/24 2147  HGB 14.4  --   HCT 42.0  --   PLT 281  --   HEPARINUNFRC  --  0.13*  CREATININE 1.12  --     Estimated Creatinine Clearance: 90.2 mL/min (by C-G formula based on SCr of 1.12 mg/dL).   Medical History: Past Medical History:  Diagnosis Date   Hypertension    Obesity     Medications:  Infusions:   heparin  1,500 Units/hr (09/27/24 1852)    Assessment: 58 yom presented to the ED with flank pain. To start IV heparin  for afib and renal infarct. He was previously on apixaban  for afib but verified he no longer takes it. CBC is WNL.  1st HL 0.13, subtherapeutic on 1500 units/hr No bleeding reported per RN  Goal of Therapy:  Heparin  level 0.3-0.7 units/ml Monitor platelets by anticoagulation protocol: Yes   Plan:  Heparin  bolus 3000 units IV x 1 Increase heparin  drip to 1800 units/hr Check a 6 hr heparin  level Daily heparin  level and CBC  Leeroy Mace RPh 09/27/2024, 10:51 PM

## 2024-09-27 NOTE — ED Triage Notes (Signed)
 Pt caox4 c/o R flank pain since yesterday, nausea this morning. Denies PMH kidney stones. Denies fever, hematuria or dysuria. Last took tylenol  approx 1 hr ago.

## 2024-09-27 NOTE — Plan of Care (Signed)

## 2024-09-27 NOTE — Progress Notes (Signed)
 PHARMACY - ANTICOAGULATION CONSULT NOTE  Pharmacy Consult for heparin  Indication: atrial fibrillation and renal infarct  Allergies  Allergen Reactions   Penicillins Other (See Comments)    Unknown reaction during childhood    Patient Measurements: Height: 5' 7 (170.2 cm) Weight: 122.5 kg (270 lb) IBW/kg (Calculated) : 66.1 HEPARIN  DW (KG): 94.6  Vital Signs: Temp: 97.6 F (36.4 C) (10/31 1239) Temp Source: Oral (10/31 1239) BP: 133/82 (10/31 1430) Pulse Rate: 98 (10/31 1430)  Labs: Recent Labs    09/27/24 0858  HGB 14.4  HCT 42.0  PLT 281  CREATININE 1.12    Estimated Creatinine Clearance: 90.2 mL/min (by C-G formula based on SCr of 1.12 mg/dL).   Medical History: Past Medical History:  Diagnosis Date   Hypertension    Obesity     Medications:  Infusions:   heparin       Assessment: 4 yom presented to the ED with flank pain. To start IV heparin  for afib and renal infarct. He was previously on apixaban  for afib but verified he no longer takes it. CBC is WNL.  Goal of Therapy:  Heparin  level 0.3-0.7 units/ml Monitor platelets by anticoagulation protocol: Yes   Plan:  Heparin  bolus 5500 units IV x 1 Heparin  gtt 1500 units/hr Check a 6 hr heparin  level Daily heparin  level and CBC  Yoko Mcgahee, Vernell Helling 09/27/2024,3:23 PM

## 2024-09-27 NOTE — ED Provider Notes (Signed)
 [place] EMERGENCY DEPARTMENT AT [facility] Provider Note   CSN: [id number] Arrival date & time: 09/27/24  9171     Patient presents with: Flank Pain   Ethan Clayburn is a 58 y.o. male.   HPI 58 year old male complaining of right flank pain that began yesterday.  He had associated nausea this morning.  Denies history of kidney stone, hematuria, fever, dysuria, or urinary frequency.  He has had no trauma to this area.  He is supposed to be on blood thinners but is not currently taking them.  Took Tylenol  just prior to admission but has not had any relief.    Prior to Admission medications   Medication Sig Start Date End Date Taking? Authorizing Provider  apixaban  (ELIQUIS ) 5 MG TABS tablet Take 1 tablet (5 mg total) by mouth 2 (two) times daily. 12/26/23   Joyce Norleen BROCKS, MD  losartan -hydrochlorothiazide  (HYZAAR) 100-12.5 MG tablet Take 1 tablet by mouth daily. 12/26/23   Joyce Norleen BROCKS, MD  metoprolol  succinate (TOPROL -XL) 100 MG 24 hr tablet Take 1 tablet (100 mg total) by mouth daily. Take with or immediately following a meal. 01/30/24   Joyce Norleen BROCKS, MD  rosuvastatin  (CRESTOR ) 40 MG tablet Take 1 tablet (40 mg total) by mouth daily. 12/26/23   Joyce Norleen BROCKS, MD    Allergies: Penicillins    Review of Systems  Updated Vital Signs BP 133/82 (BP Location: Right Arm)   Pulse 98   Temp 97.6 F (36.4 C) (Oral)   Resp 18   Ht 1.702 m (5' 7)   Wt 122.5 kg   SpO2 100%   BMI 42.29 kg/m   Physical Exam Vitals reviewed.  Constitutional:      Appearance: Normal appearance.  HENT:     Head: Normocephalic.     Right Ear: External ear normal.     Left Ear: External ear normal.     Nose: Nose normal.     Mouth/Throat:     Pharynx: Oropharynx is clear.  Eyes:     Pupils: Pupils are equal, round, and reactive to light.  Cardiovascular:     Rate and Rhythm: Regular rhythm.     Pulses: Normal pulses.     Heart sounds: Normal heart sounds.  Pulmonary:      Effort: Pulmonary effort is normal.     Breath sounds: Normal breath sounds.  Abdominal:     General: Abdomen is flat.     Palpations: Abdomen is soft.     Tenderness: There is abdominal tenderness.     Comments: No CVA tenderness. Diffuse right-sided abdominal tenderness without rebound or masses noted  Musculoskeletal:        General: Normal range of motion.     Cervical back: Normal range of motion.  Skin:    General: Skin is warm.     Capillary Refill: Capillary refill takes less than 2 seconds.  Neurological:     General: No focal deficit present.     Mental Status: He is alert.  Psychiatric:        Mood and Affect: Mood normal.     (all labs ordered are listed, but only abnormal results are displayed) Labs Reviewed  URINALYSIS, ROUTINE W REFLEX MICROSCOPIC - Abnormal; Notable for the following components:      Result Value   Glucose, UA 500 (*)    Hgb urine dipstick TRACE (*)    Ketones, ur TRACE (*)    Protein, ur 100 (*)  All other components within normal limits  COMPREHENSIVE METABOLIC PANEL WITH GFR - Abnormal; Notable for the following components:   Chloride 96 (*)    Glucose, Bld 181 (*)    Total Bilirubin 1.4 (*)    All other components within normal limits  CBC  LIPASE, BLOOD  HEPARIN  LEVEL (UNFRACTIONATED)    EKG: None  Radiology: MR Abdomen W or Wo Contrast Result Date: 09/27/2024 CLINICAL DATA:  Right-sided abdominal pain. Left lower pole renal lesion on same day CT EXAM: MRI ABDOMEN WITHOUT AND WITH CONTRAST TECHNIQUE: Multiplanar multisequence MR imaging of the abdomen was performed both before and after the administration of intravenous contrast. CONTRAST:  10mL GADAVIST GADOBUTROL 1 MMOL/ML IV SOLN COMPARISON:  Same day CT abdomen and pelvis FINDINGS: Lower chest: No acute findings. Hepatobiliary: Nonenhancing 10 mm segment 2 cyst (19:18). No bile duct dilation. Normal gallbladder. Pancreas: T2 hyperintense cystic foci within the pancreatic body  and tail measuring up to 4 mm (3:18, 23). No main ductal dilation, mass lesion, or abnormal enhancement. Spleen: Nonenhancing cyst in the anterior spleen measures 5.2 x 4.3 cm (15:22) and contains a thin internal septation at the inferior aspect (21:32). Adrenals/Urinary Tract: No adrenal nodules. Nonenhancement of the anterior right kidney demonstrates diffusion restriction and sharp demarcation with the normally enhancing posterior kidney. The right renal artery enhances normally to the level of the bifurcation at the renal hilum (15:61) where the anterior branch demonstrates non enhancement. Bilateral simple cysts. T2 isointense, T1 mildly hyperintense exophytic anterior left lower pole lesion measuring 1.1 cm (15:72) demonstrates subtle hypoenhancement. Stomach/Bowel: Visualized portions within the abdomen are unremarkable. Normal appendix (3:25) when correlated with same day CT. Vascular/Lymphatic: No pathologically enlarged lymph nodes identified. No abdominal aortic aneurysm demonstrated. Other:  None. Musculoskeletal: No suspicious bone lesions identified. Partially imaged right bladder herniation into the inguinal canal (3:21), better evaluated on same day CT. IMPRESSION: 1. Right anterior renal infarction. 2. Exophytic anterior left lower pole lesion measuring 1.1 cm demonstrates subtle hypoenhancement, suspicious for papillary renal cell carcinoma. 3. 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 No main ductal dilation, mass lesion, or abnormal enhancement. Recommend follow-up pre and post contrast MRI/MRCP in 2 years. 4. Partially imaged right bladder herniation into the inguinal canal, better evaluated on same day CT. Normal appendix. Electronically Signed   By: Limin  Xu M.D.   On: 09/27/2024 14:14   CT ABDOMEN PELVIS WO CONTRAST Result Date: 09/27/2024 EXAM: CT ABDOMEN AND PELVIS WITHOUT CONTRAST 09/27/2024  09:15:53 AM TECHNIQUE: CT of the abdomen and pelvis was performed without the administration of intravenous contrast. Multiplanar reformatted images are provided for review. Automated exposure control, iterative reconstruction, and/or weight-based adjustment of the mA/kV was utilized to reduce the radiation dose to as low as reasonably achievable. COMPARISON: None available. CLINICAL HISTORY: Abdominal pain, acute, nonlocalized. FINDINGS: LOWER CHEST: No acute abnormality. LIVER: The liver is unremarkable. GALLBLADDER AND BILE DUCTS: Gallbladder is unremarkable. No biliary ductal dilatation. SPLEEN: 5.3 cm splenic low density most consistent with hemangioma or cyst. PANCREAS: No acute abnormality. ADRENAL GLANDS: No acute abnormality. KIDNEYS, URETERS AND BLADDER: 13 mm exophytic hyperdense abnormality arising from lower pole of left kidney most consistent with hyperdense cysts but renal ultrasound is recommended for confirmation and to rule out neoplasm. Simple right renal cyst is noted. Per consensus, no follow-up is needed for simple Bosniak type 1 and 2 renal cysts, unless the patient has a malignancy history or risk  factors. Large fat-containing right inguinal hernia is noted. This appears to also contain a portion of the urinary bladder proximally. Small fat-containing left inguinal hernia is noted. No stones in the kidneys or ureters. No hydronephrosis. No perinephric or periureteral stranding. GI AND BOWEL: Stomach demonstrates no acute abnormality. There is no bowel obstruction. PERITONEUM AND RETROPERITONEUM: No ascites. No free air. VASCULATURE: Aortic atherosclerosis. Aorta is normal in caliber. LYMPH NODES: No lymphadenopathy. REPRODUCTIVE ORGANS: No acute abnormality. BONES AND SOFT TISSUES: No acute osseous abnormality. No focal soft tissue abnormality. IMPRESSION: 1. 13 mm exophytic hyperdense lesion arising from the lower pole of the left kidney, indeterminate; recommend renal ultrasound to evaluate  for hyperdense cyst versus mass. 2. Large predominantly fat-containing right inguinal hernia containing a portion of the urinary bladder proximally. 3. Small fat-containing left inguinal hernia. Electronically signed by: Lynwood Seip MD 09/27/2024 09:45 AM EDT RP Workstation: [id number]     .Critical Care  Performed by: Levander Houston, MD Authorized by: Levander Houston, MD   Critical care provider statement:    Critical care time (minutes):  60   Critical care end time:  09/27/2024 3:23 PM   Critical care time was exclusive of:  Separately billable procedures and treating other patients and teaching time   Critical care was time spent personally by me on the following activities:  Development of treatment plan with patient or surrogate, discussions with consultants, evaluation of patient's response to treatment, examination of patient, ordering and review of laboratory studies, ordering and review of radiographic studies, ordering and performing treatments and interventions, pulse oximetry, re-evaluation of patient's condition and review of old charts    Medications Ordered in the ED  morphine (PF) 2 MG/ML injection 2 mg (has no administration in time range)  heparin  bolus via infusion 5,500 Units (has no administration in time range)  heparin  ADULT infusion 100 units/mL (25000 units/250mL) (has no administration in time range)  fentaNYL (SUBLIMAZE) injection 25 mcg (25 mcg Intravenous Given 09/27/24 0906)  morphine (PF) 2 MG/ML injection 2 mg (2 mg Intravenous Given 09/27/24 1030)  ketorolac (TORADOL) 15 MG/ML injection 15 mg (15 mg Intravenous Given 09/27/24 1122)  LORazepam  (ATIVAN ) injection 0.5 mg (0.5 mg Intravenous Given 09/27/24 1229)  gadobutrol (GADAVIST) 1 MMOL/ML injection 10 mL (10 mLs Intravenous Contrast Given 09/27/24 1354)    Clinical Course as of 09/27/24 1538  Fri Sep 27, 2024  1536 Hgb urine dipstick(!): TRACE [DR]    Clinical Course User Index [DR] Levander Houston, MD                                  Medical Decision Making Amount and/or Complexity of Data Reviewed Labs: ordered. Decision-making details documented in ED Course. Radiology: ordered.  Risk Prescription drug management.   58 year old male presents today complaining of right flank pain.  Patient initially evaluated CT scan that shows 13 mm exophytic hyperdense lesion in the lower pole of the left kidney and large predominantly fat containing right inguinal hernia.  Small fat-containing left inguinal hernia.  Neither of these correlate with his symptoms. Patient continues have some tenderness in the anterior aspect of the abdomen.  Ultrasound is not available and MRI was obtained.  MRI is significant for right anterior renal infarct. EKG obtained significant for A-fib. Patient has been noncompliant with his anticoagulation. Consulted with urology, Ole New, PA, and Penne Nose, physician on-call. They advised that no further urology follow-up is needed  Patient heparinized here in ED Patient requiring ongoing parenteral pain medication. Patient requiring anticoagulation will need to be transition to oral anticoagulation. Consult with hospitalist for admission pending Discussed with Dr. Sonjia who accepts the patient for admission    Final diagnoses:  Renal infarct  Noncompliance with medications  Atrial fibrillation, unspecified type [facility])    ED Discharge Orders     None          Levander Houston, MD 09/27/24 1540

## 2024-09-28 DIAGNOSIS — I160 Hypertensive urgency: Secondary | ICD-10-CM | POA: Diagnosis not present

## 2024-09-28 DIAGNOSIS — K869 Disease of pancreas, unspecified: Secondary | ICD-10-CM | POA: Diagnosis not present

## 2024-09-28 DIAGNOSIS — Z91148 Patient's other noncompliance with medication regimen for other reason: Secondary | ICD-10-CM | POA: Diagnosis not present

## 2024-09-28 DIAGNOSIS — N280 Ischemia and infarction of kidney: Secondary | ICD-10-CM | POA: Diagnosis not present

## 2024-09-28 LAB — LIPID PANEL
Cholesterol: 185 mg/dL (ref 0–200)
HDL: 43 mg/dL (ref >40)
LDL Cholesterol: 122 mg/dL — ABNORMAL HIGH (ref 0–99)
Total CHOL/HDL Ratio: 4.3 ratio
Triglycerides: 98 mg/dL (ref <150)
VLDL: 20 mg/dL (ref 0–40)

## 2024-09-28 LAB — HEMOGLOBIN A1C
Hgb A1c MFr Bld: 6.8 % — ABNORMAL HIGH (ref 4.8–5.6)
Mean Plasma Glucose: 148.46 mg/dL

## 2024-09-28 LAB — GLUCOSE, CAPILLARY
Glucose-Capillary: 185 mg/dL — ABNORMAL HIGH (ref 70–99)
Glucose-Capillary: 247 mg/dL — ABNORMAL HIGH (ref 70–99)

## 2024-09-28 LAB — HEPARIN LEVEL (UNFRACTIONATED): Heparin Unfractionated: 0.26 [IU]/mL — ABNORMAL LOW (ref 0.30–0.70)

## 2024-09-28 LAB — BASIC METABOLIC PANEL WITH GFR
Anion gap: 13 (ref 5–15)
BUN: 20 mg/dL (ref 6–20)
CO2: 25 mmol/L (ref 22–32)
Calcium: 9.1 mg/dL (ref 8.9–10.3)
Chloride: 97 mmol/L — ABNORMAL LOW (ref 98–111)
Creatinine, Ser: 1.08 mg/dL (ref 0.61–1.24)
GFR, Estimated: >60 mL/min (ref >60)
Glucose, Bld: 165 mg/dL — ABNORMAL HIGH (ref 70–99)
Potassium: 3.6 mmol/L (ref 3.5–5.1)
Sodium: 135 mmol/L (ref 135–145)

## 2024-09-28 LAB — CBC
HCT: 46.1 % (ref 39.0–52.0)
Hemoglobin: 14.8 g/dL (ref 13.0–17.0)
MCH: 28.6 pg (ref 26.0–34.0)
MCHC: 32.1 g/dL (ref 30.0–36.0)
MCV: 89.2 fL (ref 80.0–100.0)
Platelets: 297 K/uL (ref 150–400)
RBC: 5.17 MIL/uL (ref 4.22–5.81)
RDW: 13.6 % (ref 11.5–15.5)
WBC: 16.1 K/uL — ABNORMAL HIGH (ref 4.0–10.5)
nRBC: 0 % (ref 0.0–0.2)

## 2024-09-28 LAB — HIV ANTIBODY (ROUTINE TESTING W REFLEX): HIV Screen 4th Generation wRfx: NONREACTIVE

## 2024-09-28 MED ORDER — HEPARIN BOLUS VIA INFUSION
1500.0000 [IU] | Freq: Once | INTRAVENOUS | Status: AC
  Administered 2024-09-28: 1500 [IU] via INTRAVENOUS
  Filled 2024-09-28: qty 1500

## 2024-09-28 MED ORDER — OXYCODONE HCL 5 MG PO TABS: 5.0000 mg | ORAL_TABLET | ORAL | 0 refills | Status: DC | PRN

## 2024-09-28 MED ORDER — APIXABAN 5 MG PO TABS: 5.0000 mg | ORAL_TABLET | Freq: Two times a day (BID) | ORAL | 1 refills | Status: AC

## 2024-09-28 MED ORDER — APIXABAN 5 MG PO TABS: 5.0000 mg | ORAL_TABLET | Freq: Two times a day (BID) | ORAL | 1 refills | Status: DC

## 2024-09-28 NOTE — Progress Notes (Signed)
 PHARMACY - ANTICOAGULATION CONSULT NOTE  Pharmacy Consult for heparin  Indication: atrial fibrillation and renal infarct  Allergies  Allergen Reactions   Penicillins Other (See Comments)    Unknown reaction during childhood    Patient Measurements: Height: 5' 7 (170.2 cm) Weight: 122.5 kg (270 lb) IBW/kg (Calculated) : 66.1 HEPARIN  DW (KG): 94.6  Vital Signs: Temp: 97.8 F (36.6 C) (11/01 0557) Temp Source: Oral (11/01 0557) BP: 179/82 (11/01 0557) Pulse Rate: 86 (11/01 0557)  Labs: Recent Labs    09/27/24 0858 09/27/24 2147 09/28/24 0543  HGB 14.4  --  14.8  HCT 42.0  --  46.1  PLT 281  --  297  HEPARINUNFRC  --  0.13* 0.26*  CREATININE 1.12  --  1.08    Estimated Creatinine Clearance: 93.5 mL/min (by C-G formula based on SCr of 1.08 mg/dL).   Assessment: 62 yom presented to the ED with flank pain. To start IV heparin  for afib and renal infarct. He was previously on apixaban  for afib but verified he no longer takes it. CBC is WNL.  09/28/2024 Heparin  level 0.26 after 3000 unit bolus and heparin  rate increase to 1800 units/hr - slightly subtherapeutic. CBC remains WNL No bleeding per RN No infusion issues per RN   Goal of Therapy:  Heparin  level 0.3-0.7 units/ml Monitor platelets by anticoagulation protocol: Yes   Plan:  Heparin  bolus 1500 units IV x 1 Increase heparin  drip to 2000 units/hr Check heparin  level 6 hrs after bolus and rate increase Daily heparin  level and CBC F/u transition to Eliquis   Rosaline IVAR Edison, Pharm.D Use secure chat for questions 09/28/2024 7:42 AM

## 2024-09-28 NOTE — Progress Notes (Addendum)
 Patient has been discharged. Instructions reviewed with the patient and his brother. Pt/brother request that medication be sent to [facility] [address], [place], [place] [address]. Hospitalist  provider aware. Pt encouraged to adhere to medication regimen. No change from am assessment. A&Ox4, ambulatory without assistance.

## 2024-09-28 NOTE — Discharge Summary (Signed)
 Physician Discharge Summary   Patient: Stanley Wilkerson MRN: [id number] DOB: 1966/03/24  Admit date:     09/27/2024  Discharge date: 09/28/24  Discharge Physician: Carliss LELON Canales   PCP: Joyce Norleen BROCKS, MD   Recommendations at discharge:    Pt to be discharged home.   If you experience worsening fever, chills, chest pain, shortness of breath, or other concerning symptoms, please call your PCP or go to the emergency department immediately.  Discharge Diagnoses: Principal Problem:   Renal infarct Active Problems:   Renal lesion   Pancreatic lesion   Hypertensive urgency   Type 2 diabetes mellitus with hyperglycemia, without long-term current use of insulin  (HCC)   Noncompliance with medications  Resolved Problems:   * No resolved hospital problems. *   Hospital Course:  58 y.o. male with medical history significant for A-fib on Eliquis  (noncompliant), HTN, HLD, CVA, obesity, and T2DM who presented to [facility] for evaluation of right flank pain. Patient reports that last night, he started having severe pain in his right flank described as sharp in nature.  He reports associated mild nausea this morning but no fevers, chills, dysuria, hematuria, chest pain, shortness of breath, palpitations or abdominal pain.  Patient reports he thought he had a kidney stone.  Reports he was on Eliquis  for A-fib however he stopped taking it 3 to 4 months ago due to concern about his increased risk of bleeding.  States his mother had a significant bleed while on oral anticoagulation.    [facility] Course: Initial vitals show afebrile, HR 70-90, SBP 140-190s, SpO2 98% on room air. Initial labs significant for glucose 181, otherwise normal renal function, LFTs and CBC, UA shows significant glucosuria, trace hemoglobinuria and ketonuria but no signs of infection. EKG shows A-fib with LVH.  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 Pt received IV fentanyl, IV Ativan , IV morphine and started on heparin  drip.  Urology was consulted for evaluation.  Patient admitted to [facility] service and transferred to [facility].  Assessment and Plan:  Acute right renal infarct -History of A-fib noncompliant with anticoagulation presenting with severe right flank pain.  MRI noting right anterior renal infarct likely secondary to cardioembolic origin.  Evaluated by urology with no further workup recommended.  Initially placed on heparin  drip.  Subsequently transition to p.o. Eliquis .  Patient will need to resume his Eliquis  5 mg twice daily.  Short course of oxycodone provided as well.  Avoid NSAIDs such as aspirin , ibuprofen, naproxen this can increase risk of bleeding.  Chronic atrial fibrillation - Previously noncompliant with anticoagulation.  Currently rate controlled.  Restarting Eliquis .  Prescription provided upon discharge.  Left renal lesion - MRI noting 1.1 cm left lower pole lesion suspicious of papillary renal cell carcinoma.  Incidental finding on imaging.  Will provide referral to urology for the outpatient.  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  Repeat MRI/MRCP recommended in 2 years.  Hypertensive urgency - Likely secondary to acute pain.  Recommend patient restart his home medication regimen including losartan , HCTZ, metoprolol .  Follow-up primary care physician.  Consultants: Urology Procedures performed: None Disposition: Home Diet recommendation:  Discharge Diet Orders (From admission, onward)     Start     Ordered   09/28/24 0000  Diet - low sodium heart healthy  09/28/24 1054           Cardiac diet  DISCHARGE MEDICATION: Allergies as of 09/28/2024       Reactions   Penicillins Other (See Comments)   Unknown reaction during childhood         Medication List     STOP taking these medications    aspirin  EC 81 MG tablet       TAKE these medications    apixaban  5 MG Tabs tablet Commonly known as: ELIQUIS  Take 1 tablet (5 mg total) by mouth 2 (two) times daily.   losartan -hydrochlorothiazide  100-12.5 MG tablet Commonly known as: HYZAAR Take 1 tablet by mouth daily.   metoprolol  succinate 100 MG 24 hr tablet Commonly known as: TOPROL -XL Take 1 tablet (100 mg total) by mouth daily. Take with or immediately following a meal.   oxyCODONE 5 MG immediate release tablet Commonly known as: Roxicodone Take 1 tablet (5 mg total) by mouth every 4 (four) hours as needed for severe pain (pain score 7-10).   rosuvastatin  40 MG tablet Commonly known as: CRESTOR  Take 1 tablet (40 mg total) by mouth daily.         Discharge Exam: Filed Weights   09/27/24 0834  Weight: 122.5 kg    GENERAL:  Alert, pleasant, no acute distress  HEENT:  EOMI CARDIOVASCULAR:  RRR, no murmurs appreciated RESPIRATORY:  Clear to auscultation, no wheezing, rales, or rhonchi GASTROINTESTINAL:  Soft, nontender, nondistended EXTREMITIES:  No LE edema bilaterally NEURO:  No new focal deficits appreciated SKIN:  No rashes noted PSYCH:  Appropriate mood and affect     Condition at discharge: improving  The results of significant diagnostics from this hospitalization (including imaging, microbiology, ancillary and laboratory) are listed below for reference.   Imaging Studies: MR Abdomen W or Wo Contrast Result Date: 09/27/2024 CLINICAL DATA:  Right-sided abdominal pain. Left lower pole renal lesion on same day CT EXAM: MRI ABDOMEN WITHOUT AND WITH CONTRAST TECHNIQUE: Multiplanar multisequence MR imaging of the abdomen was performed both before and after the administration of intravenous contrast. CONTRAST:  10mL GADAVIST GADOBUTROL 1 MMOL/ML IV SOLN COMPARISON:  Same day CT abdomen and pelvis FINDINGS: Lower chest: No acute findings.  Hepatobiliary: Nonenhancing 10 mm segment 2 cyst (19:18). No bile duct dilation. Normal gallbladder. Pancreas: T2 hyperintense cystic foci within the pancreatic body and tail measuring up to 4 mm (3:18, 23). No main ductal dilation, mass lesion, or abnormal enhancement. Spleen: Nonenhancing cyst in the anterior spleen measures 5.2 x 4.3 cm (15:22) and contains a thin internal septation at the inferior aspect (21:32). Adrenals/Urinary Tract: No adrenal nodules. Nonenhancement of the anterior right kidney demonstrates diffusion restriction and sharp demarcation with the normally enhancing posterior kidney. The right renal artery enhances normally to the level of the bifurcation at the renal hilum (15:61) where the anterior branch demonstrates non enhancement. Bilateral simple cysts. T2 isointense, T1 mildly hyperintense exophytic anterior left lower pole lesion measuring 1.1 cm (15:72) demonstrates subtle hypoenhancement. Stomach/Bowel: Visualized portions within the abdomen are unremarkable. Normal appendix (3:25) when correlated with same day CT. Vascular/Lymphatic: No pathologically enlarged lymph nodes identified. No abdominal aortic aneurysm demonstrated. Other:  None. Musculoskeletal: No suspicious bone lesions identified. Partially imaged right bladder herniation into the inguinal canal (3:21), better evaluated on same day CT. IMPRESSION: 1. Right anterior renal infarction. 2. Exophytic anterior left lower pole lesion measuring 1.1 cm demonstrates subtle hypoenhancement, suspicious for papillary renal cell carcinoma. 3. 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 No main ductal dilation, mass lesion, or abnormal enhancement. Recommend follow-up pre and post contrast MRI/MRCP in 2 years. 4. Partially imaged right bladder herniation into the inguinal canal, better evaluated on same day CT. Normal appendix. Electronically Signed    By: Limin  Xu M.D.   On: 09/27/2024 14:14   CT ABDOMEN PELVIS WO CONTRAST Result Date: 09/27/2024 EXAM: CT ABDOMEN AND PELVIS WITHOUT CONTRAST 09/27/2024 09:15:53 AM TECHNIQUE: CT of the abdomen and pelvis was performed without the administration of intravenous contrast. Multiplanar reformatted images are provided for review. Automated exposure control, iterative reconstruction, and/or weight-based adjustment of the mA/kV was utilized to reduce the radiation dose to as low as reasonably achievable. COMPARISON: None available. CLINICAL HISTORY: Abdominal pain, acute, nonlocalized. FINDINGS: LOWER CHEST: No acute abnormality. LIVER: The liver is unremarkable. GALLBLADDER AND BILE DUCTS: Gallbladder is unremarkable. No biliary ductal dilatation. SPLEEN: 5.3 cm splenic low density most consistent with hemangioma or cyst. PANCREAS: No acute abnormality. ADRENAL GLANDS: No acute abnormality. KIDNEYS, URETERS AND BLADDER: 13 mm exophytic hyperdense abnormality arising from lower pole of left kidney most consistent with hyperdense cysts but renal ultrasound is recommended for confirmation and to rule out neoplasm. Simple right renal cyst is noted. Per consensus, no follow-up is needed for simple Bosniak type 1 and 2 renal cysts, unless the patient has a malignancy history or risk factors. Large fat-containing right inguinal hernia is noted. This appears to also contain a portion of the urinary bladder proximally. Small fat-containing left inguinal hernia is noted. No stones in the kidneys or ureters. No hydronephrosis. No perinephric or periureteral stranding. GI AND BOWEL: Stomach demonstrates no acute abnormality. There is no bowel obstruction. PERITONEUM AND RETROPERITONEUM: No ascites. No free air. VASCULATURE: Aortic atherosclerosis. Aorta is normal in caliber. LYMPH NODES: No lymphadenopathy. REPRODUCTIVE ORGANS: No acute abnormality. BONES AND SOFT TISSUES: No acute osseous abnormality. No focal soft tissue  abnormality. IMPRESSION: 1. 13 mm exophytic hyperdense lesion arising from the lower pole of the left kidney, indeterminate; recommend renal ultrasound to evaluate for hyperdense cyst versus mass. 2. Large predominantly fat-containing right inguinal hernia containing a portion of the urinary bladder proximally. 3. Small fat-containing left inguinal hernia. Electronically signed by: Lynwood Seip MD 09/27/2024 09:45 AM EDT RP Workstation: [id number]    Microbiology: Results for orders placed or performed during the hospital encounter of 08/11/23  MRSA Next Gen by PCR, Nasal     Status: None   Collection Time: 08/12/23  1:12 PM   Specimen: Nasal Mucosa; Nasal Swab  Result Value Ref Range Status   MRSA by PCR Next Gen NOT DETECTED NOT DETECTED Final    Comment: (NOTE) The GeneXpert MRSA Assay (FDA approved for NASAL specimens only), is one component of a comprehensive MRSA colonization surveillance program. It is not intended to diagnose MRSA infection nor to guide or monitor treatment for MRSA infections. Test performance is not FDA approved in patients less than 13 years old. Performed at [facility], [address] [place], [place] [address]     Labs: CBC: Recent Labs  Lab 09/27/24 0858 09/28/24 0543  WBC 9.8 16.1*  HGB 14.4 14.8  HCT 42.0 46.1  MCV 87.0 89.2  PLT 281 297   Basic Metabolic Panel: Recent Labs  Lab 09/27/24 0858 09/28/24 0543  NA 136 135  K 3.8 3.6  CL 96* 97*  CO2 30 25  GLUCOSE 181* 165*  BUN 19 20  CREATININE  1.12 1.08  CALCIUM  9.4 9.1   Liver Function Tests: Recent Labs  Lab 09/27/24 0858  AST 40  ALT 41  ALKPHOS 91  BILITOT 1.4*  PROT 7.6  ALBUMIN 4.3   CBG: Recent Labs  Lab 09/27/24 2120 09/28/24 0731  GLUCAP 167* 185*    Discharge time spent: 29 minutes.  Length of inpatient stay: 0 days  Signed: Carliss LELON Canales, DO [facility] 09/28/2024

## 2024-10-01 ENCOUNTER — Encounter: Payer: Self-pay | Admitting: Family Medicine

## 2024-10-01 ENCOUNTER — Ambulatory Visit: Admitting: Family Medicine

## 2024-10-01 VITALS — BP 142/90 | HR 98 | Ht 67.0 in | Wt 265.2 lb

## 2024-10-01 DIAGNOSIS — N289 Disorder of kidney and ureter, unspecified: Secondary | ICD-10-CM

## 2024-10-01 DIAGNOSIS — Z9841 Cataract extraction status, right eye: Secondary | ICD-10-CM | POA: Diagnosis not present

## 2024-10-01 DIAGNOSIS — N280 Ischemia and infarction of kidney: Secondary | ICD-10-CM | POA: Diagnosis not present

## 2024-10-01 DIAGNOSIS — I482 Chronic atrial fibrillation, unspecified: Secondary | ICD-10-CM

## 2024-10-01 DIAGNOSIS — K869 Disease of pancreas, unspecified: Secondary | ICD-10-CM

## 2024-10-01 DIAGNOSIS — H4010X0 Unspecified open-angle glaucoma, stage unspecified: Secondary | ICD-10-CM | POA: Diagnosis not present

## 2024-10-01 NOTE — Progress Notes (Signed)
   Subjective:    Patient ID: Stanley Wilkerson, male    DOB: November 25, 1966, 58 y.o.   MRN: [id number]  Discussed the use of AI scribe software for clinical note transcription with the patient, who gave verbal consent to proceed.  History of Present Illness   Stanley Wilkerson is a 58 year old male with atrial fibrillation who presents with right flank pain due to a kidney infarct.  He was hospitalized on October 31st and discharged the following day after being diagnosed with a kidney infarct, which caused part of his right kidney to die. During this time, he experienced right flank pain and nausea. A scan confirmed damage to the right kidney. He was treated with pain medications and resumed Eliquis  for his atrial fibrillation, which he had not been taking regularly prior to the incident.  He reports ongoing right-sided tenderness and manages the pain with occasional use of oxycontin, having taken one or two doses since Saturday. He also uses Tylenol , which he believes helps alleviate the pain. He is not taking his blood pressure medications consistently.  There is a noted incidental finding on the left kidney and something unusual in the pancreas, discovered during his recent hospitalization.  He expresses difficulty in working due to the pain, describing himself as 'fifty or eighty percent better' compared to Friday. 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  He has a history of cataract surgery and was told he has glaucoma. He is uncertain about his follow-up with the ophthalmologist who performed the surgery and expresses dissatisfaction with the care received. He mentions seeing another eye doctor for scar tissue issues post-surgery.           Review of Systems     Objective:    Physical Exam Alert and in no distress.  Cardiac exam shows an irregular rhythm.  Lungs are clear to auscultation.  No abdominal tenderness  noted.             Assessment & Plan:  Right renal infarction with residual pain Right renal infarction with residual right-sided tenderness. Pain improved by 50-80%. No further workup needed. - Continue Tylenol  or Advil for pain management. - Provided light duty work note for the rest of the week.  Atrial fibrillation, on anticoagulation Atrial fibrillation managed with Eliquis . Emphasized regular anticoagulation to prevent embolic events. - Continue Eliquis  as prescribed. - Ensure regular follow-up and medication adherence.  Hypertension Management suboptimal due to inconsistent medication adherence. Emphasized daily adherence. - Reinforced the importance of daily blood pressure medication adherence.  Constipation Advised to avoid codeine and use alternatives for relief. - Use laxatives or dietary modifications to manage constipation.  Glaucoma Diagnosis with unclear follow-up care. Emphasized regular ophthalmologic evaluation. - Will refer back to her ophthalmologist Status post cataract surgery with visual disturbance Ongoing visual disturbances post-surgery. Unclear follow-up care. Need to identify original surgeon. - Will identify and refer to the original cataract surgeon for evaluation.  Incidental left renal lesion, follow-up recommended  Incidental pancreatic lesion, follow-up recommended Incidental pancreatic lesion noted on imaging. Follow-up recommended in a couple of years. - Will schedule follow-up MRI in a couple of years to monitor pancreatic lesion.     Over 45 minutes spent reviewing medical record and discussing care as well as making referral.

## 2024-10-21 ENCOUNTER — Ambulatory Visit: Admitting: Urology

## 2024-10-21 ENCOUNTER — Encounter: Payer: Self-pay | Admitting: Urology

## 2024-10-21 VITALS — BP 179/107 | HR 96 | Ht 67.0 in | Wt 258.0 lb

## 2024-10-21 DIAGNOSIS — N280 Ischemia and infarction of kidney: Secondary | ICD-10-CM | POA: Diagnosis not present

## 2024-10-21 DIAGNOSIS — N2889 Other specified disorders of kidney and ureter: Secondary | ICD-10-CM | POA: Diagnosis not present

## 2024-10-21 LAB — MICROSCOPIC EXAMINATION

## 2024-10-21 LAB — URINALYSIS, ROUTINE W REFLEX MICROSCOPIC
Bilirubin, UA: NEGATIVE
Ketones, UA: NEGATIVE
Leukocytes,UA: NEGATIVE
Nitrite, UA: NEGATIVE
Specific Gravity, UA: 1.02 (ref 1.005–1.030)
Urobilinogen, Ur: 0.2 mg/dL (ref 0.2–1.0)
pH, UA: 7 (ref 5.0–7.5)

## 2024-10-21 NOTE — Progress Notes (Signed)
 Assessment: 1. Renal mass, left   2. Renal infarct, right     Plan: I personally reviewed the patient's chart including provider notes, lab and imaging results. I personally reviewed the CT and MRI studies from 09/27/2024 with results as noted below. I discussed diagnosis and management of incidentally found small renal masses with the patient today.  Options for management including surveillance, surgical management with partial nephrectomy, and percutaneous ablation discussed. Will refer him to Dr. Devere at [facility] to discuss partial nephrectomy. Return to office in 3 months. Recommend he follow-up with Dr. Joyce for discussion of the pancreatic lesion.  Chief Complaint:  Chief Complaint  Patient presents with   Renal mass    History of Present Illness:  Stanley Wilkerson is a 58 y.o. male who is seen in consultation from Joyce Norleen BROCKS, MD for evaluation of renal lesion. He was admitted to the hospital on 09/27/2024 with right sided flank pain.  CT imaging from 09/27/2024 showed a 13 mm exophytic lesion arising from the lower pole of the left kidney most consistent with hyperdense cyst. MRI abdomen with and without contrast from 09/27/2024 showed nonenhancement of the anterior right kidney consistent with renal infarct, 1.1 cm exophytic anterior left lower pole lesion with subtle hypoenhancement suspicious for papillary renal cell carcinoma.  Cr 09/28/24:  1.08.  His right flank pain is improving.  This is intermittent.  He is not taking any pain medication for it.  No left-sided flank pain.  No weight loss.  No dysuria or gross hematuria. He does report problems with erectile dysfunction but is not sexually active.   Past Medical History:  Past Medical History:  Diagnosis Date   Hypertension    Obesity     Past Surgical History:  Past Surgical History:  Procedure Laterality Date   CATARACT EXTRACTION Right 07/23/2020    Allergies:  Allergies  Allergen Reactions    Penicillins Other (See Comments)    Unknown reaction during childhood    Family History:  Family History  Problem Relation Age of Onset   Heart disease Father     Social History:  Social History   Tobacco Use   Smoking status: Former   Smokeless tobacco: Never  Substance Use Topics   Alcohol use: No   Drug use: No    Review of symptoms:  Constitutional:  Negative for unexplained weight loss, night sweats, fever, chills ENT:  Negative for nose bleeds, sinus pain, painful swallowing CV:  Negative for chest pain, shortness of breath, exercise intolerance, palpitations, loss of consciousness Resp:  Negative for cough, wheezing, shortness of breath GI:  Negative for nausea, vomiting, diarrhea, bloody stools GU:  Positives noted in HPI; otherwise negative for gross hematuria, dysuria, urinary incontinence Neuro:  Negative for seizures, poor balance, limb weakness, slurred speech Psych:  Negative for lack of energy, depression, anxiety Endocrine:  Negative for polydipsia, polyuria, symptoms of hypoglycemia (dizziness, hunger, sweating) Hematologic:  Negative for anemia, purpura, petechia, prolonged or excessive bleeding, use of anticoagulants  Allergic:  Negative for difficulty breathing or choking as a result of exposure to anything; no shellfish allergy; no allergic response (rash/itch) to materials, foods  Physical exam: BP (!) 179/107   Pulse 96   Ht 5' 7 (1.702 m)   Wt 258 lb (117 kg)   BMI 40.41 kg/m  GENERAL APPEARANCE:  Well appearing, well developed, well nourished, NAD HEENT: Atraumatic, Normocephalic, oropharynx clear. NECK: Supple without lymphadenopathy or thyromegaly. LUNGS: Clear to auscultation bilaterally. HEART: Regular  Rate and Rhythm without murmurs, gallops, or rubs. ABDOMEN: Soft, non-tender, No Masses. EXTREMITIES: Moves all extremities well.  Without clubbing, cyanosis, or edema. NEUROLOGIC:  Alert and oriented x 3, normal gait, CN II-XII grossly  intact.  MENTAL STATUS:  Appropriate. BACK:  Non-tender to palpation.  No CVAT SKIN:  Warm, dry and intact.    Results: U/A: 0-5 WBCs, 0-2 RBCs

## 2024-12-05 ENCOUNTER — Telehealth: Payer: Self-pay | Admitting: Family Medicine

## 2024-12-05 NOTE — Telephone Encounter (Signed)
 Pt stopped by and asked about upcoming visits and questions.

## 2024-12-09 ENCOUNTER — Telehealth: Payer: Self-pay | Admitting: Family Medicine

## 2024-12-09 NOTE — Telephone Encounter (Signed)
 Pt called that Eliquis  was over $200 at pharmacy,  I called pharmacy and had them process with insurance & then Eliquis  co pay card which is on file.  Went thru for $10

## 2024-12-26 ENCOUNTER — Encounter: Payer: Self-pay | Admitting: Family Medicine

## 2024-12-26 ENCOUNTER — Ambulatory Visit: Payer: 59 | Admitting: Family Medicine

## 2024-12-26 VITALS — BP 150/100 | HR 88 | Ht 67.0 in | Wt 258.8 lb

## 2024-12-26 DIAGNOSIS — Z9841 Cataract extraction status, right eye: Secondary | ICD-10-CM

## 2024-12-26 DIAGNOSIS — I4891 Unspecified atrial fibrillation: Secondary | ICD-10-CM

## 2024-12-26 DIAGNOSIS — I152 Hypertension secondary to endocrine disorders: Secondary | ICD-10-CM

## 2024-12-26 DIAGNOSIS — N5201 Erectile dysfunction due to arterial insufficiency: Secondary | ICD-10-CM | POA: Diagnosis not present

## 2024-12-26 DIAGNOSIS — E1159 Type 2 diabetes mellitus with other circulatory complications: Secondary | ICD-10-CM | POA: Diagnosis not present

## 2024-12-26 DIAGNOSIS — Z0000 Encounter for general adult medical examination without abnormal findings: Secondary | ICD-10-CM

## 2024-12-26 DIAGNOSIS — E1121 Type 2 diabetes mellitus with diabetic nephropathy: Secondary | ICD-10-CM

## 2024-12-26 DIAGNOSIS — E118 Type 2 diabetes mellitus with unspecified complications: Secondary | ICD-10-CM | POA: Diagnosis not present

## 2024-12-26 DIAGNOSIS — E1169 Type 2 diabetes mellitus with other specified complication: Secondary | ICD-10-CM

## 2024-12-26 DIAGNOSIS — Z2821 Immunization not carried out because of patient refusal: Secondary | ICD-10-CM | POA: Insufficient documentation

## 2024-12-26 DIAGNOSIS — H4010X1 Unspecified open-angle glaucoma, mild stage: Secondary | ICD-10-CM | POA: Diagnosis not present

## 2024-12-26 DIAGNOSIS — N2889 Other specified disorders of kidney and ureter: Secondary | ICD-10-CM

## 2024-12-26 DIAGNOSIS — E785 Hyperlipidemia, unspecified: Secondary | ICD-10-CM

## 2024-12-26 DIAGNOSIS — Z8249 Family history of ischemic heart disease and other diseases of the circulatory system: Secondary | ICD-10-CM

## 2024-12-26 LAB — POCT UA - MICROALBUMIN
Albumin/Creatinine Ratio, Urine, POC: 125.1
Creatinine, POC: 129.2 mg/dL
Microalbumin Ur, POC: 161.6 mg/L

## 2024-12-26 LAB — LIPID PANEL

## 2024-12-26 LAB — POCT GLYCOSYLATED HEMOGLOBIN (HGB A1C): Hemoglobin A1C: 7.1 % — AB (ref 4.0–5.6)

## 2024-12-26 MED ORDER — ROSUVASTATIN CALCIUM 40 MG PO TABS: 40.0000 mg | ORAL_TABLET | Freq: Every day | ORAL | 3 refills | Status: AC

## 2024-12-26 MED ORDER — TADALAFIL 20 MG PO TABS: 10.0000 mg | ORAL_TABLET | ORAL | 11 refills | Status: AC | PRN

## 2024-12-26 MED ORDER — METOPROLOL SUCCINATE ER 100 MG PO TB24: 100.0000 mg | ORAL_TABLET | Freq: Every day | ORAL | 3 refills | Status: AC

## 2024-12-26 MED ORDER — LOSARTAN POTASSIUM-HCTZ 100-12.5 MG PO TABS: 1.0000 | ORAL_TABLET | Freq: Every day | ORAL | 3 refills | Status: AC

## 2024-12-26 NOTE — Progress Notes (Signed)
 "  Complete physical exam  Patient: Stanley Wilkerson   DOB: 06-Nov-1966   59 y.o. Male  MRN: [id number]  Subjective:    Chief Complaint  Patient presents with   Annual Exam    Fasting    Stanley Wilkerson is a 59 y.o. male who presents today for a complete physical exam.  He reports consuming a general diet. The patient does not participate in regular exercise at present. He generally feels fairly well. He reports sleeping poorly. Discussed the use of AI scribe software for clinical note transcription with the patient, who gave verbal consent to proceed.   He has been experiencing erectile dysfunction, which he attributes to his medications and diabetes. He has not been sexually active and notes a lack of morning erections. He has not tried Viagra  due to concerns about side effects but is open to trying Cialis .  He is currently taking losartan  for hypertension but does not take it daily. He also takes metoprolol  and methotrexate. He has a history of a mini-stroke. He has been prescribed rosuvastatin  for cholesterol management but has not been taking it regularly.  He has a history of a blood clot in his right kidney and is under observation by a urology there is also a mass on his left kidney, which is being monitored.  He has been diagnosed with glaucoma in both eyes and has had cataract surgery on his right eye. He reports better vision in his left eye compared to his right.  He is taking Eliquis  for atrial fibrillation.  He has a history of a right bladder hernia into the inguinal canal, which is currently asymptomatic.  He does not smoke or drink alcohol. He has lost weight from 280 lbs to 250 lbs.     Discussed immunizations with him and he is not interested in any shots. Most recent fall risk assessment:    12/26/2024    2:53 PM  Fall Risk   Falls in the past year? 0  Number falls in past yr: 0  Injury with Fall? 0  Risk for fall due to : No Fall Risks  Follow up  Falls evaluation completed     Most recent depression screenings:    12/26/2024    2:53 PM 12/26/2023    9:52 AM  PHQ 2/9 Scores  PHQ - 2 Score 0 0    Vision: Last appt 1/26 [facility] Dentist: more than 5 years, unsure of who last appt was with. Urologist: Adine Manly MD 11/25    Immunization History  Administered Date(s) Administered   Tdap 02/07/2019    Health Maintenance  Topic Date Due   OPHTHALMOLOGY EXAM  Never done   Hepatitis B Vaccines 19-59 Average Risk (1 of 3 - 19+ 3-dose series) Never done   Fecal DNA (Cologuard)  05/16/2023   FOOT EXAM  06/17/2023   Diabetic kidney evaluation - Urine ACR  12/25/2024   Zoster Vaccines- Shingrix (1 of 2) 01/01/2025 (Originally 05/15/2016)   Influenza Vaccine  02/25/2025 (Originally 06/28/2024)   Pneumococcal Vaccine: 50+ Years (1 of 2 - PCV) 10/01/2025 (Originally 05/15/1985)   HEMOGLOBIN A1C  03/28/2025   Diabetic kidney evaluation - eGFR measurement  09/28/2025   DTaP/Tdap/Td (2 - Td or Tdap) 02/06/2029   HPV VACCINES (No Doses Required) Completed   Hepatitis C Screening  Completed   HIV Screening  Completed   Meningococcal B Vaccine  Aged Out   Colonoscopy  Discontinued   COVID-19 Vaccine  Discontinued  Patient Care Team: Joyce Norleen BROCKS, MD as PCP - General (Family Medicine)   Show/hide medication list[1]  Review of Systems  All other systems reviewed and are negative.   Family and social history as well as health maintenance and immunizations was reviewed.     Objective:    BP (!) 150/100   Pulse 88   Ht 5' 7 (1.702 m)   Wt 258 lb 12.8 oz (117.4 kg)   SpO2 97%   BMI 40.53 kg/m    Physical Exam   Alert and in no distress. Tympanic membranes and canals are normal. Pharyngeal area is normal. Neck is supple without adenopathy or thyromegaly. Cardiac exam shows a regular sinus rhythm without murmurs or gallops. Lungs are clear to auscultation. Hemoglobin A1c is 7.1   Assessment & Plan:     Routine general medical examination at a health care facility - Plan: POCT UA - Microalbumin, HgB A1c, Lipid Panel, Comprehensive metabolic panel, CBC with Differential/Platelet  Controlled type 2 diabetes mellitus with complication, without long-term current use of insulin  (HCC) - Plan: POCT UA - Microalbumin, HgB A1c  Hypertension associated with diabetes (HCC) - Plan: Comprehensive metabolic panel, CBC with Differential/Platelet, losartan -hydrochlorothiazide  (HYZAAR) 100-12.5 MG tablet, metoprolol  succinate (TOPROL -XL) 100 MG 24 hr tablet  Hyperlipidemia LDL goal <70 - Plan: Lipid Panel  Family history of heart disease in male family member before age 76  Atrial fibrillation, unspecified type (HCC)  Morbid obesity (HCC)  Open-angle glaucoma of both eyes, mild stage, unspecified open-angle glaucoma type  Diabetic nephropathy associated with type 2 diabetes mellitus (HCC)  Renal mass, left  Erectile dysfunction due to arterial insufficiency - Plan: tadalafil  (CIALIS ) 20 MG tablet  Immunization refused  History of cataract surgery, right  Hyperlipidemia associated with type 2 diabetes mellitus (HCC) - Plan: rosuvastatin  (CRESTOR ) 40 MG tablet  Encouraged him to take all of his medications regularly specifically the cholesterol medicines and your blood pressure medications.  He is doing a good job with the Eliquis .  He will follow-up with ophthalmology as well as urology.  His medications will be renewed. Return in about 6 months (around 06/25/2025).      Norleen Joyce, MD      [1]  Outpatient Medications Prior to Visit  Medication Sig   apixaban  (ELIQUIS ) 5 MG TABS tablet Take 1 tablet (5 mg total) by mouth 2 (two) times daily.   [DISCONTINUED] losartan -hydrochlorothiazide  (HYZAAR) 100-12.5 MG tablet Take 1 tablet by mouth daily.   [DISCONTINUED] metoprolol  succinate (TOPROL -XL) 100 MG 24 hr tablet Take 1 tablet (100 mg total) by mouth daily. Take with or immediately  following a meal.   [DISCONTINUED] oxyCODONE  (ROXICODONE ) 5 MG immediate release tablet Take 1 tablet (5 mg total) by mouth every 4 (four) hours as needed for severe pain (pain score 7-10).   [DISCONTINUED] rosuvastatin  (CRESTOR ) 40 MG tablet Take 1 tablet (40 mg total) by mouth daily. (Patient not taking: Reported on 12/26/2024)   No facility-administered medications prior to visit.   "

## 2024-12-27 ENCOUNTER — Ambulatory Visit: Payer: Self-pay | Admitting: Family Medicine

## 2024-12-27 LAB — COMPREHENSIVE METABOLIC PANEL WITH GFR
ALT: 18 [IU]/L (ref 0–44)
AST: 17 [IU]/L (ref 0–40)
Albumin: 4.1 g/dL (ref 3.8–4.9)
Alkaline Phosphatase: 70 [IU]/L (ref 47–123)
BUN/Creatinine Ratio: 18 (ref 9–20)
BUN: 19 mg/dL (ref 6–24)
Bilirubin Total: 1 mg/dL (ref 0.0–1.2)
CO2: 24 mmol/L (ref 20–29)
Calcium: 9.1 mg/dL (ref 8.7–10.2)
Chloride: 99 mmol/L (ref 96–106)
Creatinine, Ser: 1.05 mg/dL (ref 0.76–1.27)
Globulin, Total: 2.7 g/dL (ref 1.5–4.5)
Glucose: 106 mg/dL — ABNORMAL HIGH (ref 70–99)
Potassium: 3.5 mmol/L (ref 3.5–5.2)
Sodium: 139 mmol/L (ref 134–144)
Total Protein: 6.8 g/dL (ref 6.0–8.5)
eGFR: 82 mL/min/{1.73_m2}

## 2024-12-27 LAB — CBC WITH DIFFERENTIAL/PLATELET
Basophils Absolute: 0 10*3/uL (ref 0.0–0.2)
Basos: 1 %
EOS (ABSOLUTE): 0.3 10*3/uL (ref 0.0–0.4)
Eos: 4 %
Hematocrit: 41.6 % (ref 37.5–51.0)
Hemoglobin: 14.1 g/dL (ref 13.0–17.7)
Immature Grans (Abs): 0 10*3/uL (ref 0.0–0.1)
Immature Granulocytes: 0 %
Lymphocytes Absolute: 1.7 10*3/uL (ref 0.7–3.1)
Lymphs: 27 %
MCH: 29.9 pg (ref 26.6–33.0)
MCHC: 33.9 g/dL (ref 31.5–35.7)
MCV: 88 fL (ref 79–97)
Monocytes Absolute: 0.4 10*3/uL (ref 0.1–0.9)
Monocytes: 7 %
Neutrophils Absolute: 3.8 10*3/uL (ref 1.4–7.0)
Neutrophils: 61 %
Platelets: 253 10*3/uL (ref 150–450)
RBC: 4.72 x10E6/uL (ref 4.14–5.80)
RDW: 13.2 % (ref 11.6–15.4)
WBC: 6.2 10*3/uL (ref 3.4–10.8)

## 2024-12-27 LAB — LIPID PANEL
Chol/HDL Ratio: 4.5 ratio (ref 0.0–5.0)
Cholesterol, Total: 196 mg/dL (ref 100–199)
HDL: 44 mg/dL
LDL Chol Calc (NIH): 136 mg/dL — ABNORMAL HIGH (ref 0–99)
Triglycerides: 90 mg/dL (ref 0–149)
VLDL Cholesterol Cal: 16 mg/dL (ref 5–40)

## 2025-01-24 ENCOUNTER — Ambulatory Visit: Admitting: Urology

## 2025-06-24 ENCOUNTER — Ambulatory Visit: Admitting: Family Medicine
